# Patient Record
Sex: Female | Born: 1955 | Race: White | Hispanic: No | Marital: Married | Smoking: Never smoker
Health system: Southern US, Community
[De-identification: ages and names within clinical notes are randomized; demographics above are authoritative.]

## PROBLEM LIST (undated history)

## (undated) DIAGNOSIS — H539 Unspecified visual disturbance: Secondary | ICD-10-CM

## (undated) DIAGNOSIS — R001 Bradycardia, unspecified: Secondary | ICD-10-CM

## (undated) DIAGNOSIS — N200 Calculus of kidney: Secondary | ICD-10-CM

## (undated) DIAGNOSIS — C4492 Squamous cell carcinoma of skin, unspecified: Secondary | ICD-10-CM

## (undated) DIAGNOSIS — J45909 Unspecified asthma, uncomplicated: Secondary | ICD-10-CM

## (undated) DIAGNOSIS — R008 Other abnormalities of heart beat: Secondary | ICD-10-CM

## (undated) DIAGNOSIS — T7840XA Allergy, unspecified, initial encounter: Secondary | ICD-10-CM

## (undated) DIAGNOSIS — J42 Unspecified chronic bronchitis: Secondary | ICD-10-CM

## (undated) HISTORY — DX: Unspecified asthma, uncomplicated: J45.909

## (undated) HISTORY — DX: Unspecified visual disturbance: H53.9

## (undated) HISTORY — DX: Unspecified chronic bronchitis: J42

## (undated) HISTORY — DX: Calculus of kidney: N20.0

## (undated) HISTORY — DX: Squamous cell carcinoma of skin, unspecified: C44.92

## (undated) HISTORY — DX: Bradycardia, unspecified: R00.1

## (undated) HISTORY — PX: KIDNEY STONE SURGERY: SHX686

## (undated) HISTORY — PX: WRIST SURGERY: SHX841

## (undated) HISTORY — DX: Other abnormalities of heart beat: R00.8

## (undated) HISTORY — PX: CHOLECYSTECTOMY: SHX55

## (undated) HISTORY — DX: Allergy, unspecified, initial encounter: T78.40XA

---

## 2014-12-13 LAB — HM COLONOSCOPY

## 2018-05-13 ENCOUNTER — Other Ambulatory Visit: Payer: Self-pay

## 2018-05-13 ENCOUNTER — Emergency Department (HOSPITAL_COMMUNITY): Payer: BLUE CROSS/BLUE SHIELD

## 2018-05-13 ENCOUNTER — Encounter (HOSPITAL_COMMUNITY): Payer: Self-pay | Admitting: Emergency Medicine

## 2018-05-13 ENCOUNTER — Emergency Department (HOSPITAL_COMMUNITY)
Admission: EM | Admit: 2018-05-13 | Discharge: 2018-05-13 | Disposition: A | Discharge status: Home / Self Care | Payer: BLUE CROSS/BLUE SHIELD | Attending: Emergency Medicine | Admitting: Emergency Medicine

## 2018-05-13 DIAGNOSIS — M542 Cervicalgia: Secondary | ICD-10-CM | POA: Diagnosis not present

## 2018-05-13 DIAGNOSIS — Y999 Unspecified external cause status: Secondary | ICD-10-CM | POA: Diagnosis not present

## 2018-05-13 DIAGNOSIS — R1084 Generalized abdominal pain: Secondary | ICD-10-CM | POA: Insufficient documentation

## 2018-05-13 DIAGNOSIS — Y939 Activity, unspecified: Secondary | ICD-10-CM | POA: Diagnosis not present

## 2018-05-13 DIAGNOSIS — R410 Disorientation, unspecified: Secondary | ICD-10-CM | POA: Insufficient documentation

## 2018-05-13 DIAGNOSIS — S2241XA Multiple fractures of ribs, right side, initial encounter for closed fracture: Secondary | ICD-10-CM | POA: Diagnosis not present

## 2018-05-13 DIAGNOSIS — Y92410 Unspecified street and highway as the place of occurrence of the external cause: Secondary | ICD-10-CM | POA: Diagnosis not present

## 2018-05-13 DIAGNOSIS — T07XXXA Unspecified multiple injuries, initial encounter: Secondary | ICD-10-CM

## 2018-05-13 DIAGNOSIS — S299XXA Unspecified injury of thorax, initial encounter: Secondary | ICD-10-CM | POA: Diagnosis present

## 2018-05-13 DIAGNOSIS — S2241XD Multiple fractures of ribs, right side, subsequent encounter for fracture with routine healing: Secondary | ICD-10-CM

## 2018-05-13 LAB — COMPREHENSIVE METABOLIC PANEL
ALBUMIN: 4 g/dL (ref 3.5–5.0)
ALT: 50 U/L — AB (ref 0–44)
AST: 46 U/L — AB (ref 15–41)
Alkaline Phosphatase: 87 U/L (ref 38–126)
Anion gap: 10 (ref 5–15)
BUN: 12 mg/dL (ref 8–23)
CO2: 24 mmol/L (ref 22–32)
CREATININE: 0.66 mg/dL (ref 0.44–1.00)
Calcium: 9.1 mg/dL (ref 8.9–10.3)
Chloride: 105 mmol/L (ref 98–111)
GFR calc Af Amer: >60 mL/min (ref >60)
GFR calc non Af Amer: >60 mL/min (ref >60)
Glucose, Bld: 126 mg/dL — ABNORMAL HIGH (ref 70–99)
Potassium: 3.6 mmol/L (ref 3.5–5.1)
Sodium: 139 mmol/L (ref 135–145)
Total Bilirubin: 2.3 mg/dL — ABNORMAL HIGH (ref 0.3–1.2)
Total Protein: 6.6 g/dL (ref 6.5–8.1)

## 2018-05-13 LAB — CBC
HCT: 38.7 % (ref 36.0–46.0)
Hemoglobin: 12.1 g/dL (ref 12.0–15.0)
MCH: 29.9 pg (ref 26.0–34.0)
MCHC: 31.3 g/dL (ref 30.0–36.0)
MCV: 95.6 fL (ref 80.0–100.0)
Platelets: 242 10*3/uL (ref 150–400)
RBC: 4.05 MIL/uL (ref 3.87–5.11)
RDW: 13.2 % (ref 11.5–15.5)
WBC: 8 10*3/uL (ref 4.0–10.5)
nRBC: 0 % (ref 0.0–0.2)

## 2018-05-13 MED ORDER — IOHEXOL 300 MG/ML  SOLN
100.0000 mL | Freq: Once | INTRAMUSCULAR | Status: AC | PRN
  Administered 2018-05-13: 100 mL via INTRAVENOUS

## 2018-05-13 MED ORDER — ONDANSETRON 4 MG PO TBDP
4.0000 mg | ORAL_TABLET | Freq: Once | ORAL | Status: AC | PRN
  Administered 2018-05-13: 4 mg via ORAL
  Filled 2018-05-13: qty 1

## 2018-05-13 MED ORDER — OXYCODONE-ACETAMINOPHEN 5-325 MG PO TABS
1.0000 | ORAL_TABLET | Freq: Once | ORAL | Status: AC
  Administered 2018-05-13: 1 via ORAL
  Filled 2018-05-13: qty 1

## 2018-05-13 MED ORDER — ONDANSETRON 4 MG PO TBDP: 4.0000 mg | ORAL_TABLET | Freq: Three times a day (TID) | ORAL | 0 refills | Status: DC | PRN

## 2018-05-13 MED ORDER — ONDANSETRON 4 MG PO TBDP
4.0000 mg | ORAL_TABLET | Freq: Once | ORAL | Status: AC
  Administered 2018-05-13: 4 mg via ORAL
  Filled 2018-05-13: qty 1
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
## 2018-05-13 NOTE — Discharge Instructions (Addendum)
Take the pain medication that you were previously prescribed.   Take the zofran before you take the pain medication.   Use the incentive spirometer as directed.   Follow with your primary care doctor in a week to repeat chest x-ray to make sure these improving.  Return to emergency department for any persistent fever, difficulty breathing, worsening pain or any other worsening or concerning symptoms.
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
## 2018-05-13 NOTE — ED Notes (Signed)
Patient verbalizes understanding of discharge instructions. Opportunity for questioning and answers were provided. Armband removed by staff, pt discharged from ED via wheelchair to home.  

## 2019-06-30 ENCOUNTER — Other Ambulatory Visit: Payer: Self-pay | Admitting: Family Medicine

## 2019-06-30 DIAGNOSIS — N6489 Other specified disorders of breast: Secondary | ICD-10-CM

## 2019-07-20 ENCOUNTER — Other Ambulatory Visit: Payer: BLUE CROSS/BLUE SHIELD

## 2019-07-27 ENCOUNTER — Encounter: Payer: Self-pay | Admitting: Family Medicine

## 2019-07-27 ENCOUNTER — Other Ambulatory Visit: Payer: Self-pay

## 2019-07-27 ENCOUNTER — Ambulatory Visit (INDEPENDENT_AMBULATORY_CARE_PROVIDER_SITE_OTHER): Payer: 59 | Admitting: Family Medicine

## 2019-07-27 VITALS — BP 112/64 | Temp 97.6°F | Ht 64.0 in | Wt 126.0 lb

## 2019-07-27 DIAGNOSIS — Z0000 Encounter for general adult medical examination without abnormal findings: Secondary | ICD-10-CM

## 2019-07-27 DIAGNOSIS — Z23 Encounter for immunization: Secondary | ICD-10-CM | POA: Diagnosis not present

## 2019-07-27 DIAGNOSIS — Z124 Encounter for screening for malignant neoplasm of cervix: Secondary | ICD-10-CM | POA: Diagnosis not present

## 2019-07-27 DIAGNOSIS — Z1231 Encounter for screening mammogram for malignant neoplasm of breast: Secondary | ICD-10-CM

## 2019-07-27 NOTE — Patient Instructions (Signed)

## 2019-07-28 LAB — COMP. METABOLIC PANEL (12)
AST: 15 IU/L (ref 0–40)
Albumin/Globulin Ratio: 2.1 (ref 1.2–2.2)
Albumin: 4.7 g/dL (ref 3.8–4.8)
Alkaline Phosphatase: 125 IU/L — ABNORMAL HIGH (ref 39–117)
BUN/Creatinine Ratio: 21 (ref 12–28)
BUN: 17 mg/dL (ref 8–27)
Bilirubin Total: 1 mg/dL (ref 0.0–1.2)
Calcium: 9.3 mg/dL (ref 8.7–10.3)
Chloride: 103 mmol/L (ref 96–106)
Creatinine, Ser: 0.82 mg/dL (ref 0.57–1.00)
GFR calc Af Amer: 88 mL/min/{1.73_m2} (ref >59)
GFR calc non Af Amer: 76 mL/min/{1.73_m2} (ref >59)
Globulin, Total: 2.2 g/dL (ref 1.5–4.5)
Glucose: 91 mg/dL (ref 65–99)
Potassium: 4.5 mmol/L (ref 3.5–5.2)
Sodium: 139 mmol/L (ref 134–144)
Total Protein: 6.9 g/dL (ref 6.0–8.5)

## 2019-07-28 LAB — LIPID PANEL
Chol/HDL Ratio: 2.2 ratio (ref 0.0–4.4)
Cholesterol, Total: 184 mg/dL (ref 100–199)
HDL: 85 mg/dL (ref >39)
LDL Chol Calc (NIH): 88 mg/dL (ref 0–99)
Triglycerides: 59 mg/dL (ref 0–149)
VLDL Cholesterol Cal: 11 mg/dL (ref 5–40)

## 2019-07-28 LAB — CBC WITH DIFFERENTIAL/PLATELET
Basophils Absolute: 0 10*3/uL (ref 0.0–0.2)
Basos: 1 %
EOS (ABSOLUTE): 0.1 10*3/uL (ref 0.0–0.4)
Eos: 1 %
Hematocrit: 40.8 % (ref 34.0–46.6)
Hemoglobin: 13.7 g/dL (ref 11.1–15.9)
Immature Grans (Abs): 0 10*3/uL (ref 0.0–0.1)
Immature Granulocytes: 0 %
Lymphocytes Absolute: 1.4 10*3/uL (ref 0.7–3.1)
Lymphs: 29 %
MCH: 31.1 pg (ref 26.6–33.0)
MCHC: 33.6 g/dL (ref 31.5–35.7)
MCV: 93 fL (ref 79–97)
Monocytes Absolute: 0.4 10*3/uL (ref 0.1–0.9)
Monocytes: 8 %
Neutrophils Absolute: 3 10*3/uL (ref 1.4–7.0)
Neutrophils: 61 %
Platelets: 288 10*3/uL (ref 150–450)
RBC: 4.41 x10E6/uL (ref 3.77–5.28)
RDW: 12.3 % (ref 11.7–15.4)
WBC: 4.9 10*3/uL (ref 3.4–10.8)

## 2019-07-28 LAB — CARDIOVASCULAR RISK ASSESSMENT

## 2019-07-28 LAB — TSH: TSH: 1.48 u[IU]/mL (ref 0.450–4.500)

## 2019-07-30 LAB — IGP,CTNG,APTIMAHPV,RFX16/18,45
Chlamydia, Nuc. Acid Amp: NEGATIVE
Gonococcus by Nucleic Acid Amp: NEGATIVE
HPV Aptima: NEGATIVE
PAP Smear Comment: 0

## 2019-08-06 ENCOUNTER — Encounter: Payer: Self-pay | Admitting: Family Medicine

## 2019-08-06 DIAGNOSIS — Z23 Encounter for immunization: Secondary | ICD-10-CM | POA: Insufficient documentation

## 2019-08-06 NOTE — Assessment & Plan Note (Signed)
Healthy female. Continue eating healthy and exercising.  Education given.  Pap taken Mammogram ordered. Pneumovax 23 given (asthma)
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
## 2020-06-10 ENCOUNTER — Encounter: Payer: Self-pay | Admitting: Nurse Practitioner

## 2020-06-10 ENCOUNTER — Telehealth (INDEPENDENT_AMBULATORY_CARE_PROVIDER_SITE_OTHER): Payer: 59 | Admitting: Nurse Practitioner

## 2020-06-10 VITALS — Ht 64.0 in | Wt 121.0 lb

## 2020-06-10 DIAGNOSIS — R059 Cough, unspecified: Secondary | ICD-10-CM | POA: Diagnosis not present

## 2020-06-10 DIAGNOSIS — Z20822 Contact with and (suspected) exposure to covid-19: Secondary | ICD-10-CM

## 2020-06-10 DIAGNOSIS — J029 Acute pharyngitis, unspecified: Secondary | ICD-10-CM | POA: Diagnosis not present

## 2020-06-10 DIAGNOSIS — R0989 Other specified symptoms and signs involving the circulatory and respiratory systems: Secondary | ICD-10-CM | POA: Diagnosis not present

## 2020-06-10 DIAGNOSIS — J4521 Mild intermittent asthma with (acute) exacerbation: Secondary | ICD-10-CM

## 2020-06-10 DIAGNOSIS — J45901 Unspecified asthma with (acute) exacerbation: Secondary | ICD-10-CM

## 2020-06-10 LAB — POC COVID19 BINAXNOW: SARS Coronavirus 2 Ag: NEGATIVE

## 2020-06-10 LAB — POCT RAPID STREP A (OFFICE): Rapid Strep A Screen: NEGATIVE

## 2020-06-10 MED ORDER — AZITHROMYCIN 250 MG PO TABS: ORAL_TABLET | ORAL | 0 refills | Status: DC

## 2020-06-10 MED ORDER — BENZONATATE 100 MG PO CAPS: 200.0000 mg | ORAL_CAPSULE | Freq: Three times a day (TID) | ORAL | 0 refills | Status: DC | PRN

## 2020-06-10 MED ORDER — ALBUTEROL SULFATE HFA 108 (90 BASE) MCG/ACT IN AERS: 2.0000 | INHALATION_SPRAY | Freq: Four times a day (QID) | RESPIRATORY_TRACT | 0 refills | Status: DC | PRN

## 2020-06-10 NOTE — Progress Notes (Signed)
Started Christmas time. Went to get tested but too full and didn't wait. Worried of bronchitis. Was using albuterol inhaler near the beginning but hasn't in the past couple days.   Complains nasal congestion, chest congestion, SOB (hx of asthma), sore throat, cough,  Denies headache, fever.  No one else in house is sick. Got sick when went to visit kids in [place] were sick but didn't know at time. Lasted about 5 days but symptoms are still lingering.   Has had 2 covid vaccines, did have flu shot.
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
## 2020-06-11 ENCOUNTER — Encounter: Payer: Self-pay | Admitting: Nurse Practitioner

## 2020-06-13 LAB — NOVEL CORONAVIRUS, NAA: SARS-CoV-2, NAA: NOT DETECTED

## 2020-07-02 ENCOUNTER — Other Ambulatory Visit: Payer: Self-pay | Admitting: Nurse Practitioner

## 2020-07-02 DIAGNOSIS — J45901 Unspecified asthma with (acute) exacerbation: Secondary | ICD-10-CM

## 2020-07-02 DIAGNOSIS — R059 Cough, unspecified: Secondary | ICD-10-CM

## 2020-08-17 ENCOUNTER — Encounter: Payer: Self-pay | Admitting: Nurse Practitioner

## 2020-08-17 ENCOUNTER — Other Ambulatory Visit: Payer: Self-pay

## 2020-08-17 ENCOUNTER — Telehealth (INDEPENDENT_AMBULATORY_CARE_PROVIDER_SITE_OTHER): Payer: 59 | Admitting: Nurse Practitioner

## 2020-08-17 VITALS — Ht 64.0 in | Wt 123.5 lb

## 2020-08-17 DIAGNOSIS — J3081 Allergic rhinitis due to animal (cat) (dog) hair and dander: Secondary | ICD-10-CM | POA: Diagnosis not present

## 2020-08-17 MED ORDER — NOREL AD 4-10-325 MG PO TABS: 1.0000 | ORAL_TABLET | ORAL | 1 refills | Status: DC | PRN
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
## 2020-11-16 ENCOUNTER — Other Ambulatory Visit: Payer: Self-pay

## 2020-11-16 DIAGNOSIS — Z205 Contact with and (suspected) exposure to viral hepatitis: Secondary | ICD-10-CM

## 2020-11-17 ENCOUNTER — Other Ambulatory Visit: Payer: 59

## 2020-11-17 ENCOUNTER — Other Ambulatory Visit: Payer: Self-pay

## 2020-11-17 DIAGNOSIS — Z205 Contact with and (suspected) exposure to viral hepatitis: Secondary | ICD-10-CM

## 2020-11-18 LAB — ACUTE HEP PANEL AND HEP B SURFACE AB
Hep A IgM: NEGATIVE
Hep B C IgM: NEGATIVE
Hep C Virus Ab: <0.1 s/co ratio (ref 0.0–0.9)
Hepatitis B Surf Ab Quant: 6.6 m[IU]/mL — ABNORMAL LOW (ref >9.9)
Hepatitis B Surface Ag: NEGATIVE

## 2020-11-21 ENCOUNTER — Ambulatory Visit (INDEPENDENT_AMBULATORY_CARE_PROVIDER_SITE_OTHER): Payer: 59 | Admitting: Legal Medicine

## 2020-11-21 ENCOUNTER — Encounter: Payer: Self-pay | Admitting: Legal Medicine

## 2020-11-21 VITALS — BP 90/60 | HR 62 | Temp 97.6°F | Resp 15 | Ht 64.0 in | Wt 118.0 lb

## 2020-11-21 DIAGNOSIS — B181 Chronic viral hepatitis B without delta-agent: Secondary | ICD-10-CM

## 2020-11-21 DIAGNOSIS — Z1231 Encounter for screening mammogram for malignant neoplasm of breast: Secondary | ICD-10-CM | POA: Diagnosis not present
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
## 2020-11-22 LAB — HEPATIC FUNCTION PANEL
ALT: 10 IU/L (ref 0–32)
AST: 13 IU/L (ref 0–40)
Albumin: 4.4 g/dL (ref 3.8–4.8)
Alkaline Phosphatase: 114 IU/L (ref 44–121)
Bilirubin Total: 1.3 mg/dL — ABNORMAL HIGH (ref 0.0–1.2)
Bilirubin, Direct: 0.29 mg/dL (ref 0.00–0.40)
Total Protein: 6.8 g/dL (ref 6.0–8.5)

## 2020-11-22 LAB — HEPATITIS B CORE AB W/REFLEX: Hep B Core Total Ab: NEGATIVE

## 2020-11-22 LAB — HEPATITIS B E ANTIGEN: Hep B E Ag: NEGATIVE

## 2020-11-22 NOTE — Progress Notes (Signed)
Hepatitis B core antibody neg, e antigen negative, bilirubin increased 1.3 , no chronic active hepatitis, ct 2019 does not show any cirrhosis- at this point we will watch. lp

## 2020-11-28 ENCOUNTER — Ambulatory Visit
Admission: RE | Admit: 2020-11-28 | Discharge: 2020-11-28 | Disposition: A | Discharge status: Home / Self Care | Payer: 59 | Source: Ambulatory Visit | Attending: Legal Medicine | Admitting: Legal Medicine

## 2020-11-28 ENCOUNTER — Other Ambulatory Visit: Payer: Self-pay | Admitting: Legal Medicine

## 2020-11-28 ENCOUNTER — Other Ambulatory Visit: Payer: Self-pay

## 2020-11-28 DIAGNOSIS — Z1231 Encounter for screening mammogram for malignant neoplasm of breast: Secondary | ICD-10-CM

## 2020-11-30 NOTE — Progress Notes (Signed)
Mammogram normal lp

## 2021-02-27 ENCOUNTER — Other Ambulatory Visit: Payer: Self-pay

## 2021-02-27 ENCOUNTER — Encounter: Payer: Self-pay | Admitting: Family Medicine

## 2021-02-27 ENCOUNTER — Ambulatory Visit (INDEPENDENT_AMBULATORY_CARE_PROVIDER_SITE_OTHER): Payer: 59 | Admitting: Family Medicine

## 2021-02-27 VITALS — BP 116/68 | HR 72 | Temp 97.0°F | Resp 16 | Ht 60.0 in | Wt 114.2 lb

## 2021-02-27 DIAGNOSIS — Z23 Encounter for immunization: Secondary | ICD-10-CM

## 2021-02-27 DIAGNOSIS — R3589 Other polyuria: Secondary | ICD-10-CM | POA: Diagnosis not present

## 2021-02-27 HISTORY — DX: Other polyuria: R35.89

## 2021-02-27 LAB — POCT URINALYSIS DIPSTICK
Bilirubin, UA: NEGATIVE
Blood, UA: NEGATIVE
Glucose, UA: NEGATIVE
Ketones, UA: NEGATIVE
Leukocytes, UA: NEGATIVE
Nitrite, UA: NEGATIVE
Protein, UA: NEGATIVE
Spec Grav, UA: <=1.005 — AB (ref 1.010–1.025)
Urobilinogen, UA: 0.2 E.U./dL
pH, UA: 6.5 (ref 5.0–8.0)

## 2021-02-27 LAB — GLUCOSE, POCT (MANUAL RESULT ENTRY): POC Glucose: 99 mg/dl (ref 70–99)

## 2021-02-27 NOTE — Assessment & Plan Note (Signed)
Checking labs.  Urinalysis normal.  Consider medicine for urgency.  Likely refer to urology after labs.
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
## 2021-02-27 NOTE — Addendum Note (Signed)
Addended by: Effie Shy on: 02/27/2021 02:26 PM   Modules accepted: Orders

## 2021-02-27 NOTE — Patient Instructions (Signed)
Checking labs.  Urinalysis normal.  Consider medicine for urgency.  Likely refer to urology after labs.

## 2021-02-28 LAB — CBC WITH DIFFERENTIAL/PLATELET
Basophils Absolute: 0 10*3/uL (ref 0.0–0.2)
Basos: 1 %
EOS (ABSOLUTE): 0.1 10*3/uL (ref 0.0–0.4)
Eos: 1 %
Hematocrit: 43.2 % (ref 34.0–46.6)
Hemoglobin: 14.8 g/dL (ref 11.1–15.9)
Immature Grans (Abs): 0 10*3/uL (ref 0.0–0.1)
Immature Granulocytes: 0 %
Lymphocytes Absolute: 1.9 10*3/uL (ref 0.7–3.1)
Lymphs: 33 %
MCH: 31 pg (ref 26.6–33.0)
MCHC: 34.3 g/dL (ref 31.5–35.7)
MCV: 91 fL (ref 79–97)
Monocytes Absolute: 0.4 10*3/uL (ref 0.1–0.9)
Monocytes: 7 %
Neutrophils Absolute: 3.3 10*3/uL (ref 1.4–7.0)
Neutrophils: 58 %
Platelets: 354 10*3/uL (ref 150–450)
RBC: 4.77 x10E6/uL (ref 3.77–5.28)
RDW: 12.6 % (ref 11.7–15.4)
WBC: 5.8 10*3/uL (ref 3.4–10.8)

## 2021-02-28 LAB — COMPREHENSIVE METABOLIC PANEL
ALT: 8 IU/L (ref 0–32)
AST: 14 IU/L (ref 0–40)
Albumin/Globulin Ratio: 1.8 (ref 1.2–2.2)
Albumin: 4.6 g/dL (ref 3.8–4.8)
Alkaline Phosphatase: 118 IU/L (ref 44–121)
BUN/Creatinine Ratio: 17 (ref 12–28)
BUN: 13 mg/dL (ref 8–27)
Bilirubin Total: 1.2 mg/dL (ref 0.0–1.2)
CO2: 24 mmol/L (ref 20–29)
Calcium: 9.8 mg/dL (ref 8.7–10.3)
Chloride: 102 mmol/L (ref 96–106)
Creatinine, Ser: 0.78 mg/dL (ref 0.57–1.00)
Globulin, Total: 2.6 g/dL (ref 1.5–4.5)
Glucose: 91 mg/dL (ref 70–99)
Potassium: 4.9 mmol/L (ref 3.5–5.2)
Sodium: 141 mmol/L (ref 134–144)
Total Protein: 7.2 g/dL (ref 6.0–8.5)
eGFR: 85 mL/min/{1.73_m2} (ref >59)

## 2021-03-01 ENCOUNTER — Other Ambulatory Visit: Payer: Self-pay

## 2021-03-01 DIAGNOSIS — R3589 Other polyuria: Secondary | ICD-10-CM

## 2021-05-01 DIAGNOSIS — R350 Frequency of micturition: Secondary | ICD-10-CM | POA: Diagnosis not present

## 2021-05-01 DIAGNOSIS — R351 Nocturia: Secondary | ICD-10-CM | POA: Diagnosis not present

## 2021-06-08 ENCOUNTER — Encounter: Payer: Self-pay | Admitting: Legal Medicine

## 2021-06-08 ENCOUNTER — Ambulatory Visit (INDEPENDENT_AMBULATORY_CARE_PROVIDER_SITE_OTHER): Payer: Medicare HMO | Admitting: Legal Medicine

## 2021-06-08 VITALS — BP 100/60 | HR 76 | Temp 98.8°F | Resp 14 | Ht 60.0 in | Wt 111.0 lb

## 2021-06-08 DIAGNOSIS — J018 Other acute sinusitis: Secondary | ICD-10-CM

## 2021-06-08 DIAGNOSIS — R058 Other specified cough: Secondary | ICD-10-CM | POA: Diagnosis not present

## 2021-06-08 DIAGNOSIS — J4540 Moderate persistent asthma, uncomplicated: Secondary | ICD-10-CM

## 2021-06-08 DIAGNOSIS — J45909 Unspecified asthma, uncomplicated: Secondary | ICD-10-CM | POA: Insufficient documentation

## 2021-06-08 LAB — POC COVID19 BINAXNOW: SARS Coronavirus 2 Ag: NEGATIVE

## 2021-06-08 LAB — POCT INFLUENZA A/B
Influenza A, POC: NEGATIVE
Influenza B, POC: NEGATIVE

## 2021-06-08 LAB — POCT RAPID STREP A (OFFICE): Rapid Strep A Screen: NEGATIVE

## 2021-06-08 MED ORDER — PREDNISONE 10 MG (21) PO TBPK: ORAL_TABLET | ORAL | 0 refills | Status: DC

## 2021-06-08 MED ORDER — AMOXICILLIN 875 MG PO TABS: 875.0000 mg | ORAL_TABLET | Freq: Two times a day (BID) | ORAL | 0 refills | Status: AC
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
## 2021-06-21 DIAGNOSIS — H53462 Homonymous bilateral field defects, left side: Secondary | ICD-10-CM | POA: Diagnosis not present

## 2021-06-22 ENCOUNTER — Other Ambulatory Visit: Payer: Self-pay

## 2021-06-22 ENCOUNTER — Emergency Department (HOSPITAL_COMMUNITY)
Admission: EM | Admit: 2021-06-22 | Discharge: 2021-06-22 | Disposition: A | Discharge status: Home / Self Care | Payer: Medicare HMO | Attending: Emergency Medicine | Admitting: Emergency Medicine

## 2021-06-22 ENCOUNTER — Emergency Department (HOSPITAL_COMMUNITY): Payer: Medicare HMO

## 2021-06-22 ENCOUNTER — Encounter (HOSPITAL_COMMUNITY): Payer: Self-pay | Admitting: Emergency Medicine

## 2021-06-22 DIAGNOSIS — H53469 Homonymous bilateral field defects, unspecified side: Secondary | ICD-10-CM | POA: Diagnosis not present

## 2021-06-22 DIAGNOSIS — J45909 Unspecified asthma, uncomplicated: Secondary | ICD-10-CM | POA: Insufficient documentation

## 2021-06-22 DIAGNOSIS — R29818 Other symptoms and signs involving the nervous system: Secondary | ICD-10-CM | POA: Diagnosis not present

## 2021-06-22 DIAGNOSIS — H538 Other visual disturbances: Secondary | ICD-10-CM | POA: Diagnosis present

## 2021-06-22 DIAGNOSIS — I639 Cerebral infarction, unspecified: Secondary | ICD-10-CM | POA: Insufficient documentation

## 2021-06-22 DIAGNOSIS — R9431 Abnormal electrocardiogram [ECG] [EKG]: Secondary | ICD-10-CM | POA: Diagnosis not present

## 2021-06-22 DIAGNOSIS — Z85828 Personal history of other malignant neoplasm of skin: Secondary | ICD-10-CM | POA: Diagnosis not present

## 2021-06-22 DIAGNOSIS — Z7982 Long term (current) use of aspirin: Secondary | ICD-10-CM | POA: Insufficient documentation

## 2021-06-22 DIAGNOSIS — H5347 Heteronymous bilateral field defects: Secondary | ICD-10-CM

## 2021-06-22 DIAGNOSIS — I6782 Cerebral ischemia: Secondary | ICD-10-CM | POA: Diagnosis not present

## 2021-06-22 LAB — I-STAT CHEM 8, ED
BUN: 16 mg/dL (ref 8–23)
Calcium, Ion: 1.21 mmol/L (ref 1.15–1.40)
Chloride: 104 mmol/L (ref 98–111)
Creatinine, Ser: 0.6 mg/dL (ref 0.44–1.00)
Glucose, Bld: 95 mg/dL (ref 70–99)
HCT: 40 % (ref 36.0–46.0)
Hemoglobin: 13.6 g/dL (ref 12.0–15.0)
Potassium: 4 mmol/L (ref 3.5–5.1)
Sodium: 139 mmol/L (ref 135–145)
TCO2: 25 mmol/L (ref 22–32)

## 2021-06-22 LAB — CBC
HCT: 40.2 % (ref 36.0–46.0)
Hemoglobin: 13.3 g/dL (ref 12.0–15.0)
MCH: 30.9 pg (ref 26.0–34.0)
MCHC: 33.1 g/dL (ref 30.0–36.0)
MCV: 93.5 fL (ref 80.0–100.0)
Platelets: 307 10*3/uL (ref 150–400)
RBC: 4.3 MIL/uL (ref 3.87–5.11)
RDW: 13.2 % (ref 11.5–15.5)
WBC: 4.3 10*3/uL (ref 4.0–10.5)
nRBC: 0 % (ref 0.0–0.2)

## 2021-06-22 LAB — COMPREHENSIVE METABOLIC PANEL
ALT: 20 U/L (ref 0–44)
AST: 22 U/L (ref 15–41)
Albumin: 4.1 g/dL (ref 3.5–5.0)
Alkaline Phosphatase: 94 U/L (ref 38–126)
Anion gap: 10 (ref 5–15)
BUN: 14 mg/dL (ref 8–23)
CO2: 24 mmol/L (ref 22–32)
Calcium: 9.3 mg/dL (ref 8.9–10.3)
Chloride: 106 mmol/L (ref 98–111)
Creatinine, Ser: 0.71 mg/dL (ref 0.44–1.00)
GFR, Estimated: >60 mL/min (ref >60)
Glucose, Bld: 100 mg/dL — ABNORMAL HIGH (ref 70–99)
Potassium: 4.1 mmol/L (ref 3.5–5.1)
Sodium: 140 mmol/L (ref 135–145)
Total Bilirubin: 1.3 mg/dL — ABNORMAL HIGH (ref 0.3–1.2)
Total Protein: 6.5 g/dL (ref 6.5–8.1)

## 2021-06-22 LAB — DIFFERENTIAL
Abs Immature Granulocytes: 0.01 10*3/uL (ref 0.00–0.07)
Basophils Absolute: 0 10*3/uL (ref 0.0–0.1)
Basophils Relative: 1 %
Eosinophils Absolute: 0.1 10*3/uL (ref 0.0–0.5)
Eosinophils Relative: 1 %
Immature Granulocytes: 0 %
Lymphocytes Relative: 34 %
Lymphs Abs: 1.5 10*3/uL (ref 0.7–4.0)
Monocytes Absolute: 0.3 10*3/uL (ref 0.1–1.0)
Monocytes Relative: 7 %
Neutro Abs: 2.5 10*3/uL (ref 1.7–7.7)
Neutrophils Relative %: 57 %

## 2021-06-22 LAB — CBG MONITORING, ED: Glucose-Capillary: 85 mg/dL (ref 70–99)

## 2021-06-22 LAB — PROTIME-INR
INR: 1.1 (ref 0.8–1.2)
Prothrombin Time: 14.3 seconds (ref 11.4–15.2)

## 2021-06-22 LAB — APTT: aPTT: 36 seconds (ref 24–36)

## 2021-06-22 MED ORDER — SODIUM CHLORIDE 0.9% FLUSH: 3.0000 mL | Freq: Once | INTRAVENOUS | Status: DC

## 2021-06-22 NOTE — ED Notes (Signed)
Pt. Was at [facility] last night when vision started , had a scan and went to eye Dr. As reccommended last night and eye Dr. Durene Cal to ED
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
## 2021-06-22 NOTE — ED Notes (Signed)
Patient transported to MRI 
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
## 2021-06-22 NOTE — Discharge Instructions (Addendum)
It was a pleasure caring for you today in the emergency department.  Please follow-up with neurology and ophthalmology.  Please return to the emergency department for any worsening or worrisome symptoms.

## 2021-07-03 ENCOUNTER — Encounter: Payer: Self-pay | Admitting: *Deleted

## 2021-07-05 ENCOUNTER — Encounter: Payer: Self-pay | Admitting: Diagnostic Neuroimaging

## 2021-07-05 ENCOUNTER — Ambulatory Visit: Payer: Medicare HMO | Admitting: Diagnostic Neuroimaging

## 2021-07-05 VITALS — BP 120/76 | HR 64 | Ht 60.0 in | Wt 114.8 lb

## 2021-07-05 DIAGNOSIS — H539 Unspecified visual disturbance: Secondary | ICD-10-CM

## 2021-07-05 DIAGNOSIS — R69 Illness, unspecified: Secondary | ICD-10-CM | POA: Diagnosis not present

## 2021-07-05 DIAGNOSIS — Z79899 Other long term (current) drug therapy: Secondary | ICD-10-CM | POA: Diagnosis not present

## 2021-07-05 NOTE — Patient Instructions (Addendum)
°  VISUAL / COGNITIVE DISTURBANCE  - unclear etiology; gradual onset and progressive - check lab testing and neuropsychology testing - repeat MRI brain w/wo in 3-6 months - recommend to stop driving for now, at least until symptoms improve
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
## 2021-07-08 LAB — VITAMIN B1: Thiamine: 108.5 nmol/L (ref 66.5–200.0)

## 2021-07-08 LAB — VITAMIN B12: Vitamin B-12: 1166 pg/mL (ref 232–1245)

## 2021-07-08 LAB — TSH: TSH: 2.23 u[IU]/mL (ref 0.450–4.500)

## 2021-07-10 ENCOUNTER — Ambulatory Visit: Payer: Medicare HMO | Admitting: Diagnostic Neuroimaging

## 2021-07-10 DIAGNOSIS — R4182 Altered mental status, unspecified: Secondary | ICD-10-CM

## 2021-07-10 DIAGNOSIS — H539 Unspecified visual disturbance: Secondary | ICD-10-CM

## 2021-07-10 NOTE — Procedures (Signed)
° °  [facility]  EEG (ELECTROENCEPHALOGRAM) REPORT   STUDY DATE: 07/10/21 PATIENT NAME: Ashley Mcbride DOB: [birth year] MRN: [id number]  ORDERING CLINICIAN: Andrey Spearman, MD   TECHNOLOGIST: Myer Peer TECHNIQUE: Electroencephalogram was recorded utilizing standard 10-20 system of lead placement and reformatted into average and bipolar montages.  RECORDING TIME: 23 minutes  ACTIVATION: hyperventilation and photic stimulation  CLINICAL INFORMATION: 66 year old female with abnormal vision spells.  FINDINGS: Posterior dominant background rhythms, which attenuate with eye opening, ranging 8-9 hertz and 10-20 microvolts.  Intermittent left anterior temporal sharp waves with phase reversals mainly at F7 and T3.  No electrographic seizures are seen. Patient recorded in the awake and drowsy state. EKG channel shows regular rhythm of 60-65 beats per minute.   IMPRESSION:   EEG in awake and drowsy states demonstrating: - Intermittent left anterior temporal sharp waves with phase reversals mainly at F7 and T3 which are potentially epileptiform.   INTERPRETING PHYSICIAN:  Penni Bombard, MD Certified in Neurology, Neurophysiology and Neuroimaging  [facility] [address] [place], [place] [address] [phone]

## 2021-07-18 ENCOUNTER — Other Ambulatory Visit: Payer: Self-pay

## 2021-07-18 ENCOUNTER — Ambulatory Visit (INDEPENDENT_AMBULATORY_CARE_PROVIDER_SITE_OTHER): Payer: Medicare HMO | Admitting: Family Medicine

## 2021-07-18 DIAGNOSIS — H53462 Homonymous bilateral field defects, left side: Secondary | ICD-10-CM | POA: Diagnosis not present

## 2021-07-18 DIAGNOSIS — R480 Dyslexia and alexia: Secondary | ICD-10-CM

## 2021-07-18 DIAGNOSIS — H5359 Other color vision deficiencies: Secondary | ICD-10-CM | POA: Diagnosis not present
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
## 2021-07-30 ENCOUNTER — Encounter: Payer: Self-pay | Admitting: Family Medicine

## 2021-07-30 DIAGNOSIS — H5359 Other color vision deficiencies: Secondary | ICD-10-CM

## 2021-07-30 DIAGNOSIS — R480 Dyslexia and alexia: Secondary | ICD-10-CM

## 2021-07-30 DIAGNOSIS — H53462 Homonymous bilateral field defects, left side: Secondary | ICD-10-CM

## 2021-07-30 HISTORY — DX: Dyslexia and alexia: R48.0

## 2021-07-30 HISTORY — DX: Homonymous bilateral field defects, left side: H53.462

## 2021-07-30 HISTORY — DX: Other color vision deficiencies: H53.59

## 2021-07-30 NOTE — Assessment & Plan Note (Addendum)
Management per specialist.  Per Dr. Leta Baptist Repeat mri in 3-6 months per neurology.  If worsening episodes, patient to call neurology and antiseizure medications will be considered.

## 2021-07-30 NOTE — Assessment & Plan Note (Signed)
Management per specialist.  Repeat mri in 3-6 months per neurology.

## 2021-07-30 NOTE — Assessment & Plan Note (Signed)
Etiology unclear.  No driving recommended.

## 2021-08-21 ENCOUNTER — Telehealth: Payer: Self-pay

## 2021-08-21 DIAGNOSIS — R197 Diarrhea, unspecified: Secondary | ICD-10-CM | POA: Diagnosis not present

## 2021-08-21 DIAGNOSIS — E860 Dehydration: Secondary | ICD-10-CM | POA: Diagnosis not present

## 2021-08-21 DIAGNOSIS — R109 Unspecified abdominal pain: Secondary | ICD-10-CM | POA: Diagnosis not present

## 2021-08-21 DIAGNOSIS — K449 Diaphragmatic hernia without obstruction or gangrene: Secondary | ICD-10-CM | POA: Diagnosis not present

## 2021-08-21 DIAGNOSIS — R600 Localized edema: Secondary | ICD-10-CM | POA: Diagnosis not present

## 2021-08-21 DIAGNOSIS — K921 Melena: Secondary | ICD-10-CM | POA: Diagnosis not present

## 2021-08-21 NOTE — Telephone Encounter (Signed)
General/Other - diarrhea ? ?Patient calling as she has been having diarrhea for two weeks. She has had change in color of stool-yesterday. Color is now black. She describes diarrhea as the worse she has ever had. She did take pepto bismol on Saturday- one dose.  ? ?Due to ongoing diarrhea and change in color, patient will be proceeding to hospital for evaluation.  ? ?Ashley Mcbride 08/21/21 8:21 AM ? ? ?

## 2021-08-31 DIAGNOSIS — H53462 Homonymous bilateral field defects, left side: Secondary | ICD-10-CM | POA: Diagnosis not present
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
## 2021-09-04 ENCOUNTER — Encounter: Payer: Self-pay | Admitting: Family Medicine

## 2021-09-04 ENCOUNTER — Ambulatory Visit (INDEPENDENT_AMBULATORY_CARE_PROVIDER_SITE_OTHER): Payer: Medicare HMO | Admitting: Family Medicine

## 2021-09-04 ENCOUNTER — Other Ambulatory Visit: Payer: Self-pay | Admitting: Diagnostic Neuroimaging

## 2021-09-04 VITALS — BP 130/78 | HR 60 | Resp 18 | Ht 64.0 in | Wt 110.4 lb

## 2021-09-04 DIAGNOSIS — Z0000 Encounter for general adult medical examination without abnormal findings: Secondary | ICD-10-CM | POA: Insufficient documentation

## 2021-09-04 DIAGNOSIS — H5359 Other color vision deficiencies: Secondary | ICD-10-CM

## 2021-09-04 DIAGNOSIS — R011 Cardiac murmur, unspecified: Secondary | ICD-10-CM | POA: Diagnosis not present

## 2021-09-04 DIAGNOSIS — R480 Dyslexia and alexia: Secondary | ICD-10-CM

## 2021-09-04 DIAGNOSIS — H53462 Homonymous bilateral field defects, left side: Secondary | ICD-10-CM

## 2021-09-04 HISTORY — DX: Cardiac murmur, unspecified: R01.1

## 2021-09-04 NOTE — Progress Notes (Signed)
Orders Placed This Encounter  ?Procedures  ? MR BRAIN W WO CONTRAST  ? MR ORBITS W WO CONTRAST  ? ? ?Penni Bombard, MD 09/09/2705, 86:75 AM ?Certified in Neurology, Neurophysiology and Neuroimaging ? ?[facility] ?[address], [address] ?[place], [place] [address] ?[phone] ? ?

## 2021-09-04 NOTE — Patient Instructions (Addendum)
Things to do to keep yourself healthy  ?- Exercise at least 30-45 minutes a day, 3-4 days a week.  ?- Eat a low-fat diet with lots of fruits and vegetables, up to 7-9 servings per day.  ?- Seatbelts can save your life. Wear them always.  ?- Smoke detectors on every level of your home, check batteries every year.  ?- Alcohol -  If you drink, do it moderately, less than 2 drinks per day.  ?- Ashley Mcbride. Choose someone to speak for you if you are not able.  ?- Depression is common in our stressful world.If you're feeling down or losing interest in things you normally enjoy, please come in for a visit.  ? ?Ordering echocardiogram. ? ?Get shingrix vaccine and tetanus vaccine at the pharmacy.  ? ? ?

## 2021-09-05 ENCOUNTER — Telehealth: Payer: Self-pay | Admitting: Diagnostic Neuroimaging

## 2021-09-05 NOTE — Telephone Encounter (Signed)
[employer] pending uploaded notes on the portal  

## 2021-09-06 NOTE — Telephone Encounter (Signed)
Ashley Mcbride: [id number] (exp. 09/05/21 to 10/05/21) order sent to GI, they will reach out to the patient to schedule.  ?

## 2021-09-14 ENCOUNTER — Ambulatory Visit (HOSPITAL_BASED_OUTPATIENT_CLINIC_OR_DEPARTMENT_OTHER)
Admission: RE | Admit: 2021-09-14 | Discharge: 2021-09-14 | Disposition: A | Discharge status: Home / Self Care | Payer: Medicare HMO | Source: Ambulatory Visit | Attending: Family Medicine | Admitting: Family Medicine

## 2021-09-14 DIAGNOSIS — R011 Cardiac murmur, unspecified: Secondary | ICD-10-CM | POA: Insufficient documentation

## 2021-09-14 LAB — ECHOCARDIOGRAM COMPLETE
AR max vel: 2.29 cm2
AV Area VTI: 2.29 cm2
AV Area mean vel: 2.06 cm2
AV Mean grad: 3 mmHg
AV Peak grad: 4.8 mmHg
Ao pk vel: 1.1 m/s
Area-P 1/2: 3.48 cm2
S' Lateral: 2.7 cm

## 2021-09-14 NOTE — Progress Notes (Signed)
?  Echocardiogram ?2D Echocardiogram has been performed. ? ?Elmer Ramp ?09/14/2021, 9:28 AM ?

## 2021-09-17 ENCOUNTER — Encounter: Payer: Self-pay | Admitting: Family Medicine

## 2021-09-18 ENCOUNTER — Telehealth: Payer: Self-pay | Admitting: Diagnostic Neuroimaging

## 2021-09-18 ENCOUNTER — Telehealth: Payer: Self-pay

## 2021-09-18 ENCOUNTER — Other Ambulatory Visit: Payer: Self-pay

## 2021-09-18 DIAGNOSIS — R480 Dyslexia and alexia: Secondary | ICD-10-CM

## 2021-09-18 DIAGNOSIS — H53462 Homonymous bilateral field defects, left side: Secondary | ICD-10-CM

## 2021-09-18 NOTE — Telephone Encounter (Signed)
Patient is scheduled at GI for 09/26/21.  ?

## 2021-09-18 NOTE — Telephone Encounter (Signed)
Patient notified  She is scheduled for an MRI next week but she wants to proceed with the different neurologist.   ?

## 2021-09-18 NOTE — Telephone Encounter (Signed)
General/Other - neurology referral  ? ? ?She is requesting another neurology referral. She is currently seeing Dr Leta Baptist, she feels he is absent and she would like answers. She has seen this provider once over the last 7 months. She is unsure of another provider to see.  ? ?Ashley Mcbride 09/18/21 9:19 AM ? ? ?

## 2021-09-26 ENCOUNTER — Ambulatory Visit
Admission: RE | Admit: 2021-09-26 | Discharge: 2021-09-26 | Disposition: A | Discharge status: Home / Self Care | Payer: Medicare HMO | Source: Ambulatory Visit | Attending: Diagnostic Neuroimaging | Admitting: Diagnostic Neuroimaging

## 2021-09-26 DIAGNOSIS — H5359 Other color vision deficiencies: Secondary | ICD-10-CM

## 2021-09-26 DIAGNOSIS — H53462 Homonymous bilateral field defects, left side: Secondary | ICD-10-CM | POA: Diagnosis not present

## 2021-09-26 DIAGNOSIS — R480 Dyslexia and alexia: Secondary | ICD-10-CM

## 2021-09-26 MED ORDER — GADOBENATE DIMEGLUMINE 529 MG/ML IV SOLN
9.0000 mL | Freq: Once | INTRAVENOUS | Status: AC | PRN
  Administered 2021-09-26: 9 mL via INTRAVENOUS

## 2021-10-02 ENCOUNTER — Encounter: Payer: Self-pay | Admitting: Neurology
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
## 2021-10-13 ENCOUNTER — Ambulatory Visit: Payer: Medicare HMO | Admitting: Neurology

## 2021-10-13 ENCOUNTER — Encounter: Payer: Self-pay | Admitting: Neurology

## 2021-10-13 VITALS — BP 113/72 | HR 73 | Ht 64.0 in | Wt 104.2 lb

## 2021-10-13 DIAGNOSIS — H53462 Homonymous bilateral field defects, left side: Secondary | ICD-10-CM

## 2021-10-13 DIAGNOSIS — R419 Unspecified symptoms and signs involving cognitive functions and awareness: Secondary | ICD-10-CM | POA: Diagnosis not present

## 2021-10-13 DIAGNOSIS — H539 Unspecified visual disturbance: Secondary | ICD-10-CM | POA: Diagnosis not present

## 2021-10-13 NOTE — Patient Instructions (Signed)
Will order neuropsychological evaluation with Dr. Melvyn Novas for May 26 ?24 ambulatory EEG ?Refer to neuro-ophthalmology at [facility] ?Further recommendations pending results. ?

## 2021-10-27 ENCOUNTER — Encounter: Payer: Medicare HMO | Admitting: Psychology

## 2021-11-03 ENCOUNTER — Encounter: Payer: Medicare HMO | Admitting: Psychology

## 2021-11-17 ENCOUNTER — Ambulatory Visit: Payer: Medicare HMO | Admitting: Psychology

## 2021-11-17 ENCOUNTER — Encounter: Payer: Self-pay | Admitting: Psychology

## 2021-11-17 DIAGNOSIS — G3184 Mild cognitive impairment, so stated: Secondary | ICD-10-CM

## 2021-11-17 DIAGNOSIS — R4189 Other symptoms and signs involving cognitive functions and awareness: Secondary | ICD-10-CM

## 2021-11-17 DIAGNOSIS — H539 Unspecified visual disturbance: Secondary | ICD-10-CM | POA: Diagnosis not present

## 2021-11-17 HISTORY — DX: Mild cognitive impairment of uncertain or unknown etiology: G31.84

## 2021-11-17 NOTE — Progress Notes (Signed)
   Psychometrician Note   Cognitive testing was administered to Ashley Mcbride by Ashley Mcbride, B.S. (psychometrist) under the supervision of Dr. Christia Reading, Ph.D., licensed psychologist on 11/17/2021. Ashley Mcbride did not appear overtly distressed by the testing session per behavioral observation or responses across self-report questionnaires. Rest breaks were offered.    The battery of tests administered was selected by Dr. Christia Reading, Ph.D. with consideration to Ashley Mcbride's current level of functioning, the nature of her symptoms, emotional and behavioral responses during interview, level of literacy, observed level of motivation/effort, and the nature of the referral question. This battery was communicated to the psychometrist. Communication between Dr. Christia Reading, Ph.D. and the psychometrist was ongoing throughout the evaluation and Dr. Christia Reading, Ph.D. was immediately accessible at all times. Dr. Christia Reading, Ph.D. provided supervision to the psychometrist on the date of this service to the extent necessary to assure the quality of all services provided.    Ashley Mcbride will return within approximately 1-2 weeks for an interactive feedback session with Ashley Mcbride at which time her test performances, clinical impressions, and treatment recommendations will be reviewed in detail. Ashley Mcbride understands she can contact our office should she require our assistance before this time.  A total of 160 minutes of billable time were spent face-to-face with Ashley Mcbride by the psychometrist. This includes both test administration and scoring time. Billing for these services is reflected in the clinical report generated by Dr. Christia Reading, Ph.D.  This note reflects time spent with the psychometrician and does not include test scores or any clinical interpretations made by Ashley Mcbride. The full report will follow in a separate note.

## 2021-11-17 NOTE — Progress Notes (Unsigned)
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 Recall 4/17 (19) <1 Exceptionally Low    Retention Percentage 29 (10) <1 Exceptionally Low    Recognition Hits 6/10 (28) 2 Well Below Average       Attention/Executive Function:     Trail Making Test (TMT): Raw Score (T Score) Percentile     Part A 154 secs.,  2 errors (17) <1 Exceptionally Low    Part B Discontinued --- Impaired         Scaled Score Percentile   WAIS-IV Coding: Discontinued (vision) --- ---       NAB Attention Module, Form 1: T Score Percentile     Digits Forward 63 91 Well Above Average    Digits Backwards 46 34 Average        Scaled Score Percentile   WAIS-IV Similarities: 6 9 Below Average       D-KEFS Color-Word Interference Test: Raw Score (Scaled Score) Percentile     Color Naming 54 secs. (1) <1 Exceptionally Low    Word Reading 36 secs. (4) 2 Well Below Average    Inhibition Discontinued --- Impaired    Inhibition/Switching Discontinued --- Impaired       D-KEFS Verbal Fluency Test: Raw Score (Scaled Score) Percentile     Letter Total Correct 25 (7) 16 Below Average    Category Total Correct 28 (7) 16 Below Average    Category Switching Total  Correct 4 (1) <1 Exceptionally Low    Category Switching Accuracy 3 (2) <1 Exceptionally Low      Total Set Loss Errors 3 (9) 37 Average      Total Repetition Errors 6 (7) 16 Below Average       Language:     Verbal Fluency Test: Raw Score (T Score) Percentile     Phonemic Fluency (FAS) 25 (35) 7 Well Below Average    Animal Fluency 14 (39) 14 Below Average        NAB Language Module, Form 1: T Score Percentile     Naming 29/31 (46) 34 Average       Visuospatial/Visuoconstruction:      Raw Score Percentile   Line Bisection: --- --- Abnormal  Clock Drawing: 5/10 --- Impaired  NAB Spatial Module, Form 1: T Score Percentile     Visual Discrimination 22 <1 Exceptionally Low    Figure Drawing Copy 20 <1 Exceptionally Low        Scaled Score Percentile   WAIS-IV Block Design: Discontinued --- Impaired  WAIS-IV Visual Puzzles: 6 9 Below Average       Mood and Personality:      Raw Score Percentile   PROMIS Depression Questionnaire: 8 --- None to Slight  PROMIS Anxiety Questionnaire: 11 --- None to Slight       Additional Questionnaires:      Raw Score Percentile   PROMIS Sleep Disturbance Questionnaire: 12 --- None to Slight   Informed Consent and Coding/Compliance:   The current evaluation represents a clinical evaluation for the purposes previously outlined by the referral source and is in no way reflective of a forensic evaluation.   Ms. Sallie was provided with a verbal description of the nature and purpose of the present neuropsychological evaluation. Also reviewed were the foreseeable risks and/or discomforts and benefits of the procedure, limits of confidentiality, and mandatory reporting requirements of this provider. The patient was given the opportunity to ask questions and receive answers about the evaluation. Oral consent to participate was provided by the patient.   This evaluation was conducted by Christia Reading, Ph.D., ABPP-CN, board certified clinical  neuropsychologist. Ms. Lawal completed a clinical interview with Dr. Melvyn Novas, billed as one unit [phone], and 160 minutes of cognitive testing and scoring, billed as one unit [phone] and four additional units [id number]. Psychometrist Milana Kidney, B.S., assisted Dr. Melvyn Novas with test administration and scoring procedures. As a separate and discrete service, Dr. Melvyn Novas spent a total of 160 minutes in interpretation and report writing billed as one unit [phone] and two units 96133.

## 2021-11-20 ENCOUNTER — Encounter: Payer: Self-pay | Admitting: Psychology

## 2021-11-20 DIAGNOSIS — H539 Unspecified visual disturbance: Secondary | ICD-10-CM | POA: Insufficient documentation

## 2021-11-23 ENCOUNTER — Telehealth: Payer: Self-pay | Admitting: Neurology

## 2021-11-23 NOTE — Telephone Encounter (Signed)
Patient called and stated she is scheduled to have her EEG on 6/27, she then noticed her appt was scheduled on that same day.  She was just a little confused as to if she needs to come in that day?

## 2021-11-24 ENCOUNTER — Ambulatory Visit: Payer: Medicare HMO | Admitting: Psychology

## 2021-11-24 DIAGNOSIS — H53462 Homonymous bilateral field defects, left side: Secondary | ICD-10-CM | POA: Diagnosis not present

## 2021-11-24 DIAGNOSIS — G3184 Mild cognitive impairment, so stated: Secondary | ICD-10-CM | POA: Diagnosis not present

## 2021-11-28 ENCOUNTER — Ambulatory Visit (INDEPENDENT_AMBULATORY_CARE_PROVIDER_SITE_OTHER): Payer: Medicare HMO | Admitting: Neurology

## 2021-11-28 DIAGNOSIS — H539 Unspecified visual disturbance: Secondary | ICD-10-CM | POA: Diagnosis not present

## 2021-11-28 DIAGNOSIS — R569 Unspecified convulsions: Secondary | ICD-10-CM | POA: Diagnosis not present

## 2021-11-28 DIAGNOSIS — R419 Unspecified symptoms and signs involving cognitive functions and awareness: Secondary | ICD-10-CM | POA: Diagnosis not present

## 2021-11-28 DIAGNOSIS — H53462 Homonymous bilateral field defects, left side: Secondary | ICD-10-CM
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
## 2021-12-06 ENCOUNTER — Ambulatory Visit: Payer: Medicare HMO | Admitting: Neurology

## 2021-12-06 ENCOUNTER — Encounter: Payer: Self-pay | Admitting: Neurology

## 2021-12-06 VITALS — BP 121/85 | HR 61 | Ht 64.0 in | Wt 106.0 lb

## 2021-12-06 DIAGNOSIS — G3184 Mild cognitive impairment, so stated: Secondary | ICD-10-CM | POA: Diagnosis not present

## 2021-12-06 MED ORDER — DONEPEZIL HCL 5 MG PO TABS: 5.0000 mg | ORAL_TABLET | Freq: Every day | ORAL | 0 refills | Status: DC

## 2021-12-06 NOTE — Progress Notes (Signed)
[id number] Approved 11/06/21-01/05/22. Rep Jhorlene Q.    Appt 12/15/21 at 10:30 am Arrive 10am NPO 6 hours before appt. At [facility] long

## 2021-12-06 NOTE — Patient Instructions (Addendum)
We will start donepezil (Aricept) '5mg'$  daily for four weeks.  If you are tolerating the medication, then after four weeks, we will increase the dose to '10mg'$  daily.  Side effects include nausea, vomiting, diarrhea, vivid dreams, and muscle cramps.  Please call the clinic if you experience any of these symptoms. Will get  PET scan of the brain to help support a diagnosis.  Further recommendations pending results. Follow up after testing.

## 2021-12-07 ENCOUNTER — Telehealth: Payer: Self-pay | Admitting: Neurology

## 2021-12-07 MED ORDER — DONEPEZIL HCL 5 MG PO TABS: 5.0000 mg | ORAL_TABLET | Freq: Every day | ORAL | 0 refills | Status: DC

## 2021-12-07 NOTE — Telephone Encounter (Signed)
Pt called in stating a prescription was sent to [facility] yesterday for her Aricept. She would like to see if it could be sent to [facility] in [place]?

## 2021-12-08 NOTE — Procedures (Signed)
Patient's Name: Ashley Mcbride MRN: [id number] Date of Birth: 02/28/1956   Ordering Provider: Metta Clines, MD DX CODE(s): R41.9, R56.9, H53.9 EXAM DURATION: 22 hours   CLINICAL HISTORY: This is a 66 year old woman with visual changes and memory loss with episodes lasting 1-2 minutes. EEG for classification.    MEDICATION(s): melatonin, Albuterol, meclizine   TECHNICAL DESCRIPTION: Long-Term EEG with Video was monitored intermittently by a qualified EEG technologist for the entirety of the recording; quality check-ins were performed at a minimum of every two hours, checking, and documenting real-time data and video to assure the integrity and quality of the recording (e.g., camera position, electrode integrity and impedance), and identify the need for maintenance. For intermittent monitoring, an EEG Technologist monitored no more than 12 patients concurrently. Video was being recorded at least 80% of the time during the study duration, unless otherwise noted in an Exception Statement.   At the end of the recording, the EEG Technologist generates a technical description, which is the EEG Technologist's written documentation of the reviewed video-EEG data, including technical interventions and these elements: reviewing raw EEG/VEEG data and events and automated detection as well as patient pushbutton event activations; and annotating, editing, and archiving EEG/VEEG data for review by the physician or other qualified healthcare professional. For review, the Video EEG recording can be visualized in all standard types of montages, 16 channels and greater, and playbacks include digital high frequency filters previously noted. The Video EEG has been notated with patient typical symptom events at the direction of the patient by depressing a push button mounted on a waist worn Lifelines EEG recording device. Digital spike and seizure detection software was used to identify potential abnormalities in the EEG, and  alerts were reviewed and annotated by the technologist in the [employer] EEG Review software. Video EEG and report are notated with events that were determined to be of significance by the digital analysis software showing spike and seizure detections.   SET-UP TECH: [place] Neighbor   RECORDING SET-UP DATE: 11/28/2021 12:01PM  RECORDING TAKE-DOWN DATE: 11/29/2021 10:20AM     SPIKE AND SEIZURE ANALYSIS AND REVIEW: Spike and seizure detection software alerts have been reviewed by a Production designer, theatre/television/film. None of these alerts appear to have clinical significance.  PUSH BUTTON EVENTS: A patient diary was maintained. The patient did not press the button nor did they describe any typical symptoms. 31 button presses were accidental, test, or monitoring tech driven resets.    DESCRIPTION OF RECORDING: During maximal wakefulness, the background activity consisted of a symmetric 8 Hz posterior dominant rhythm that was reactive to eye opening and eye closure. The background is symmetric. There were no epileptiform discharges or electrographic seizures seen in wakefulness.   During the recording, the patient progresses through wakefulness, drowsiness, and sleep. During drowsiness and sleep, there is an increase in theta slowing of the background, at times sharply contoured over the left temporal region, consistent with wicket spikes, a normal variant. Vertex waves and sleep spindles were seen. Again there were no epileptiform discharges seen.   EKG lead was unremarkable.   PUSH BUTTON EVENTS: There were no push button events.    IMPRESSION: This 22-hour ambulatory video EEG study is normal.    CLINICAL CORRELATION: A normal EEG does not rule out a clinical diagnosis of epilepsy. Typical events were not captured. If further clinical questions remain, inpatient video EEG monitoring may be helpful.

## 2021-12-12 ENCOUNTER — Ambulatory Visit (INDEPENDENT_AMBULATORY_CARE_PROVIDER_SITE_OTHER): Payer: Medicare HMO | Admitting: Family Medicine

## 2021-12-12 VITALS — BP 110/70 | HR 68 | Temp 96.1°F | Resp 14 | Ht 64.0 in | Wt 105.2 lb

## 2021-12-12 DIAGNOSIS — G319 Degenerative disease of nervous system, unspecified: Secondary | ICD-10-CM | POA: Diagnosis not present

## 2021-12-12 DIAGNOSIS — Z23 Encounter for immunization: Secondary | ICD-10-CM
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
## 2021-12-15 ENCOUNTER — Encounter (HOSPITAL_COMMUNITY)
Admission: RE | Admit: 2021-12-15 | Discharge: 2021-12-15 | Disposition: A | Discharge status: Home / Self Care | Payer: Medicare HMO | Source: Ambulatory Visit | Attending: Neurology | Admitting: Neurology

## 2021-12-15 DIAGNOSIS — G3183 Dementia with Lewy bodies: Secondary | ICD-10-CM | POA: Diagnosis not present

## 2021-12-15 DIAGNOSIS — G3184 Mild cognitive impairment, so stated: Secondary | ICD-10-CM | POA: Diagnosis not present

## 2021-12-15 DIAGNOSIS — F0390 Unspecified dementia without behavioral disturbance: Secondary | ICD-10-CM | POA: Diagnosis not present

## 2021-12-15 LAB — GLUCOSE, CAPILLARY: Glucose-Capillary: 88 mg/dL (ref 70–99)

## 2021-12-15 MED ORDER — FLUDEOXYGLUCOSE F - 18 (FDG) INJECTION
10.0000 | Freq: Once | INTRAVENOUS | Status: AC
  Administered 2021-12-15: 9.96 via INTRAVENOUS

## 2021-12-19 ENCOUNTER — Encounter: Payer: Self-pay | Admitting: Neurology

## 2021-12-19 ENCOUNTER — Ambulatory Visit: Payer: Medicare HMO | Admitting: Neurology

## 2021-12-19 VITALS — BP 115/74 | HR 56 | Ht 64.0 in | Wt 106.2 lb

## 2021-12-19 DIAGNOSIS — G319 Degenerative disease of nervous system, unspecified: Secondary | ICD-10-CM | POA: Diagnosis not present

## 2021-12-19 NOTE — Patient Instructions (Addendum)
Contact me when you need a refill of donepezil and I will increase dose to '10mg'$  at bedtime Follow up in 6 months.
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
## 2021-12-29 ENCOUNTER — Other Ambulatory Visit: Payer: Self-pay | Admitting: Neurology

## 2022-01-03 DIAGNOSIS — H53462 Homonymous bilateral field defects, left side: Secondary | ICD-10-CM | POA: Diagnosis not present

## 2022-01-05 ENCOUNTER — Telehealth: Payer: Self-pay | Admitting: Neurology

## 2022-01-05 MED ORDER — DONEPEZIL HCL 10 MG PO TABS: 10.0000 mg | ORAL_TABLET | Freq: Every day | ORAL | 5 refills | Status: DC

## 2022-01-05 NOTE — Telephone Encounter (Signed)
Advised patient script sent to the wrong pharmacy.  Will send to correct pharmacy. Cancelled at [facility].

## 2022-01-05 NOTE — Telephone Encounter (Signed)
1. Which medications need refilled? (List name and dosage, if known) Aricept   2. Which pharmacy/location is medication to be sent to? (include street and city if Management consultant) [facility] in [place]

## 2022-01-08 ENCOUNTER — Telehealth: Payer: Self-pay | Admitting: Neurology

## 2022-01-08 NOTE — Telephone Encounter (Signed)
Would stay on '5mg'$  for now until further instructions when Dr. Tomi Likens returns, thanks

## 2022-01-08 NOTE — Telephone Encounter (Signed)
Spoke to the patient husband, Patient had a whole body shaking, weakness in her lower limbs unable to walk. Patient was really scared.   Patient doing okay right now, But they wanted  know if she should just continue on the 5 mg of Donepezil.

## 2022-01-08 NOTE — Telephone Encounter (Signed)
Patients husband called and stated Ashley Mcbride had a severe reaction to the donepezil increase.

## 2022-01-08 NOTE — Telephone Encounter (Signed)
Go back on Donepezil '5mg'$ , thanks

## 2022-01-08 NOTE — Telephone Encounter (Signed)
Spoke to the patient husband to advised to decrease back to the 5 mg.  Husband wanted to know if eventually gradually increase to the 10 mg by using a pill cutter to cut the 10 mg into fourths and given the fourth with the 5 mg until eventually getting to 10 mg.   Advised husband I will send this back and see what Dr.Aqunio says.

## 2022-01-09 MED ORDER — DONEPEZIL HCL 5 MG PO TABS: 5.0000 mg | ORAL_TABLET | Freq: Every day | ORAL | 0 refills | Status: DC

## 2022-01-12 ENCOUNTER — Other Ambulatory Visit: Payer: Self-pay

## 2022-01-15 ENCOUNTER — Other Ambulatory Visit: Payer: Self-pay

## 2022-01-15 MED ORDER — DICYCLOMINE HCL 20 MG PO TABS: 20.0000 mg | ORAL_TABLET | Freq: Two times a day (BID) | ORAL | 0 refills | Status: DC | PRN

## 2022-01-15 NOTE — Telephone Encounter (Signed)
Bentyl sent. Dr. Tobie Poet

## 2022-01-16 ENCOUNTER — Other Ambulatory Visit: Payer: Self-pay

## 2022-01-16 ENCOUNTER — Telehealth: Payer: Self-pay

## 2022-01-16 NOTE — Telephone Encounter (Signed)
Ashley Mcbride called about refilling her bentyl.  She and her husband are on the way to [facility] and she will need the RX sent to a pharmacy there.  She will call us back when she arrives and let us know where to send the RX.

## 2022-01-21 ENCOUNTER — Encounter: Payer: Self-pay | Admitting: Family Medicine

## 2022-01-21 DIAGNOSIS — G319 Degenerative disease of nervous system, unspecified: Secondary | ICD-10-CM | POA: Insufficient documentation

## 2022-01-21 HISTORY — DX: Degenerative disease of nervous system, unspecified: G31.9

## 2022-01-21 NOTE — Assessment & Plan Note (Signed)
Defer management to Dr. Tomi Likens.  Continue aricept.  Keep appt for PET Scan and follow up with Dr. Traci Sermon.

## 2022-02-08 DIAGNOSIS — H5213 Myopia, bilateral: Secondary | ICD-10-CM | POA: Diagnosis not present

## 2022-02-08 DIAGNOSIS — H524 Presbyopia: Secondary | ICD-10-CM | POA: Diagnosis not present

## 2022-02-08 DIAGNOSIS — H52209 Unspecified astigmatism, unspecified eye: Secondary | ICD-10-CM | POA: Diagnosis not present

## 2022-02-14 ENCOUNTER — Ambulatory Visit: Payer: Medicare HMO | Admitting: Neurology

## 2022-04-23 ENCOUNTER — Telehealth: Payer: Self-pay

## 2022-04-23 NOTE — Telephone Encounter (Signed)
Mrs. Ashley Mcbride called with concerns that she and Mr. Ashley Mcbride have been coughing for about 1 week.  They have been taking mucinex with no relief.  She has out of town guest coming later in the week.  She reports no fever but is requesting an antibiotic.  She was instructed to log in to my chart and schedule a video visit since we do not have any appointments today.

## 2022-04-25 ENCOUNTER — Ambulatory Visit (INDEPENDENT_AMBULATORY_CARE_PROVIDER_SITE_OTHER): Payer: Medicare HMO | Admitting: Physician Assistant

## 2022-04-25 ENCOUNTER — Encounter: Payer: Self-pay | Admitting: Physician Assistant

## 2022-04-25 VITALS — BP 108/70 | HR 77 | Temp 96.5°F | Wt 105.0 lb

## 2022-04-25 DIAGNOSIS — J069 Acute upper respiratory infection, unspecified: Secondary | ICD-10-CM

## 2022-04-25 LAB — POC COVID19 BINAXNOW: SARS Coronavirus 2 Ag: NEGATIVE

## 2022-04-25 MED ORDER — AZITHROMYCIN 250 MG PO TABS: ORAL_TABLET | ORAL | 0 refills | Status: AC
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
## 2022-06-01 ENCOUNTER — Ambulatory Visit (INDEPENDENT_AMBULATORY_CARE_PROVIDER_SITE_OTHER): Payer: Medicare HMO | Admitting: Family Medicine

## 2022-06-01 VITALS — BP 138/82 | HR 52 | Temp 97.5°F | Ht 64.0 in | Wt 103.0 lb

## 2022-06-01 DIAGNOSIS — G319 Degenerative disease of nervous system, unspecified: Secondary | ICD-10-CM

## 2022-06-01 MED ORDER — MEMANTINE HCL 10 MG PO TABS: 10.0000 mg | ORAL_TABLET | Freq: Two times a day (BID) | ORAL | 3 refills | Status: DC
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
## 2022-06-05 ENCOUNTER — Encounter: Payer: Self-pay | Admitting: Family Medicine

## 2022-06-05 NOTE — Assessment & Plan Note (Addendum)
Discontinued Aricept. Start Namenda 10 mg twice daily  Follow up with neurologist. Patient to let me know if needs medicine for depression.
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
## 2022-06-21 ENCOUNTER — Encounter: Payer: Self-pay | Admitting: Neurology

## 2022-06-21 ENCOUNTER — Ambulatory Visit: Payer: Medicare HMO | Admitting: Neurology

## 2022-06-21 VITALS — BP 126/57 | HR 53 | Ht 63.0 in | Wt 105.4 lb

## 2022-06-21 DIAGNOSIS — G319 Degenerative disease of nervous system, unspecified: Secondary | ICD-10-CM

## 2022-07-09 DIAGNOSIS — H53462 Homonymous bilateral field defects, left side: Secondary | ICD-10-CM | POA: Diagnosis not present

## 2022-09-06 ENCOUNTER — Ambulatory Visit (INDEPENDENT_AMBULATORY_CARE_PROVIDER_SITE_OTHER): Payer: Medicare HMO | Admitting: Family Medicine

## 2022-09-06 ENCOUNTER — Encounter: Payer: Self-pay | Admitting: Family Medicine

## 2022-09-06 VITALS — BP 122/82 | HR 59 | Temp 97.0°F | Ht 63.0 in | Wt 103.0 lb

## 2022-09-06 DIAGNOSIS — Z13820 Encounter for screening for osteoporosis: Secondary | ICD-10-CM

## 2022-09-06 DIAGNOSIS — Z780 Asymptomatic menopausal state: Secondary | ICD-10-CM

## 2022-09-06 DIAGNOSIS — R197 Diarrhea, unspecified: Secondary | ICD-10-CM

## 2022-09-06 DIAGNOSIS — Z1231 Encounter for screening mammogram for malignant neoplasm of breast: Secondary | ICD-10-CM

## 2022-09-06 DIAGNOSIS — Z7189 Other specified counseling: Secondary | ICD-10-CM | POA: Diagnosis not present

## 2022-09-06 DIAGNOSIS — Z0000 Encounter for general adult medical examination without abnormal findings: Secondary | ICD-10-CM
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
## 2022-09-06 NOTE — Patient Instructions (Addendum)
Recommend tetanus (TDAP), Shingles vaccine series, and RSV vaccine.  Things to do to keep yourself healthy  - Exercise at least 30-45 minutes a day, 3-4 days a week.  - Eat a low-fat diet with lots of fruits and vegetables, up to 7-9 servings per day.  - Seatbelts can save your life. Wear them always.  - Smoke detectors on every level of your home, check batteries every year.  - Eye Doctor - have an eye exam every 1-2 years  - Alcohol -  If you drink, do it moderately, less than 2 drinks per day.  - [place]. Choose someone to speak for you if you are not able.  - Depression is common in our stressful world.If you're feeling down or losing interest in things you normally enjoy, please come in for a visit.  - Violence - If anyone is threatening or hurting you, please call immediately.

## 2022-09-07 ENCOUNTER — Other Ambulatory Visit: Payer: Medicare HMO

## 2022-09-07 LAB — CBC WITH DIFFERENTIAL/PLATELET
Basophils Absolute: 0 10*3/uL (ref 0.0–0.2)
Basos: 1 %
EOS (ABSOLUTE): 0.1 10*3/uL (ref 0.0–0.4)
Eos: 3 %
Hematocrit: 42.3 % (ref 34.0–46.6)
Hemoglobin: 13.9 g/dL (ref 11.1–15.9)
Immature Grans (Abs): 0 10*3/uL (ref 0.0–0.1)
Immature Granulocytes: 0 %
Lymphocytes Absolute: 1.3 10*3/uL (ref 0.7–3.1)
Lymphs: 26 %
MCH: 30.4 pg (ref 26.6–33.0)
MCHC: 32.9 g/dL (ref 31.5–35.7)
MCV: 93 fL (ref 79–97)
Monocytes Absolute: 0.4 10*3/uL (ref 0.1–0.9)
Monocytes: 8 %
Neutrophils Absolute: 3.2 10*3/uL (ref 1.4–7.0)
Neutrophils: 62 %
Platelets: 301 10*3/uL (ref 150–450)
RBC: 4.57 x10E6/uL (ref 3.77–5.28)
RDW: 12.5 % (ref 11.7–15.4)
WBC: 5.1 10*3/uL (ref 3.4–10.8)

## 2022-09-07 LAB — COMPREHENSIVE METABOLIC PANEL
ALT: 25 IU/L (ref 0–32)
AST: 21 IU/L (ref 0–40)
Albumin/Globulin Ratio: 2.2 (ref 1.2–2.2)
Albumin: 4.8 g/dL (ref 3.9–4.9)
Alkaline Phosphatase: 108 IU/L (ref 44–121)
BUN/Creatinine Ratio: 20 (ref 12–28)
BUN: 16 mg/dL (ref 8–27)
Bilirubin Total: 1.5 mg/dL — ABNORMAL HIGH (ref 0.0–1.2)
CO2: 24 mmol/L (ref 20–29)
Calcium: 9.9 mg/dL (ref 8.7–10.3)
Chloride: 101 mmol/L (ref 96–106)
Creatinine, Ser: 0.8 mg/dL (ref 0.57–1.00)
Globulin, Total: 2.2 g/dL (ref 1.5–4.5)
Glucose: 90 mg/dL (ref 70–99)
Potassium: 5.2 mmol/L (ref 3.5–5.2)
Sodium: 139 mmol/L (ref 134–144)
Total Protein: 7 g/dL (ref 6.0–8.5)
eGFR: 81 mL/min/{1.73_m2} (ref >59)

## 2022-09-07 LAB — TSH: TSH: 2.18 u[IU]/mL (ref 0.450–4.500)

## 2022-09-08 DIAGNOSIS — Z1231 Encounter for screening mammogram for malignant neoplasm of breast: Secondary | ICD-10-CM | POA: Insufficient documentation

## 2022-09-08 DIAGNOSIS — R197 Diarrhea, unspecified: Secondary | ICD-10-CM | POA: Insufficient documentation

## 2022-09-08 DIAGNOSIS — Z780 Asymptomatic menopausal state: Secondary | ICD-10-CM | POA: Insufficient documentation

## 2022-09-08 DIAGNOSIS — Z7189 Other specified counseling: Secondary | ICD-10-CM | POA: Insufficient documentation

## 2022-09-08 NOTE — Assessment & Plan Note (Signed)
Order DEXA 

## 2022-09-08 NOTE — Assessment & Plan Note (Signed)
Recommend tetanus (TDAP), Shingles vaccine series, and RSV vaccine.  Things to do to keep yourself healthy  - Exercise at least 30-45 minutes a day, 3-4 days a week.  - Eat a low-fat diet with lots of fruits and vegetables, up to 7-9 servings per day.  - Seatbelts can save your life. Wear them always.  - Smoke detectors on every level of your home, check batteries every year.  - Eye Doctor - have an eye exam every 1-2 years  - Alcohol -  If you drink, do it moderately, less than 2 drinks per day.  - Health Care Power of Attorney. Choose someone to speak for you if you are not able.  - Depression is common in our stressful world.If you're feeling down or losing interest in things you normally enjoy, please come in for a visit.  - Violence - If anyone is threatening or hurting you, please call immediately.  

## 2022-09-08 NOTE — Assessment & Plan Note (Signed)
Long discussion and education given. > 15 minutes.

## 2022-09-08 NOTE — Assessment & Plan Note (Addendum)
Check labs. Stool studies.  Recommend immodium daily as needed.

## 2022-09-08 NOTE — Assessment & Plan Note (Signed)
ORDER MAMMOGRAM °

## 2022-09-12 ENCOUNTER — Ambulatory Visit
Admission: RE | Admit: 2022-09-12 | Discharge: 2022-09-12 | Disposition: A | Discharge status: Home / Self Care | Payer: Medicare HMO | Source: Ambulatory Visit | Attending: Family Medicine | Admitting: Family Medicine

## 2022-09-12 DIAGNOSIS — Z1231 Encounter for screening mammogram for malignant neoplasm of breast: Secondary | ICD-10-CM

## 2022-10-02 IMAGING — MR MR HEAD W/O CM
9 of 10 series · 37 of 48 positions shown · non-contrast
Comparison: None.

CLINICAL DATA: Neuro deficit, acute, stroke suspected; homonymous
hemianopia

EXAM:
MRI HEAD WITHOUT CONTRAST
TECHNIQUE: Multiplanar, multiecho pulse sequences of the brain and surrounding
structures were obtained without intravenous contrast.

[Series 3: DWI · axial · 3.0mm · 1.09mm/px · z∈[-56,+73]mm · 9 of 88 slices shown (1 of 4)]
[im 1/88]
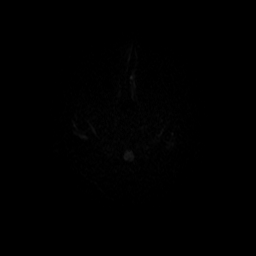
[im 11/88]
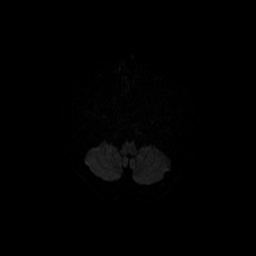
[im 22/88]
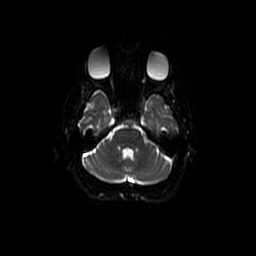
[im 33/88]
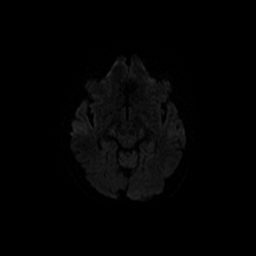
[im 44/88]
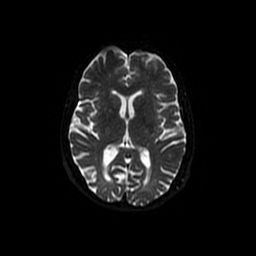
[im 55/88]
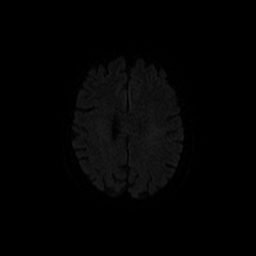
[im 66/88]
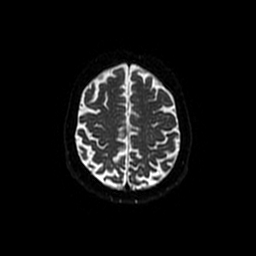
[im 77/88]
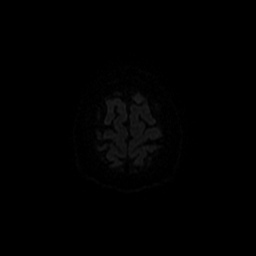
[im 88/88]
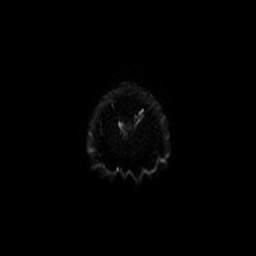

[Series 4: DWI · coronal · 5.0mm · 1.09mm/px · 7 of 68 slices shown (2 of 4)]
[im 1/68]
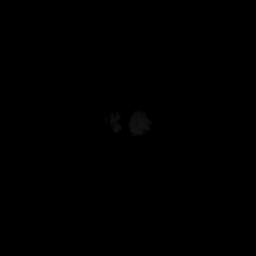
[im 12/68]
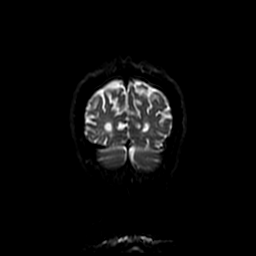
[im 23/68]
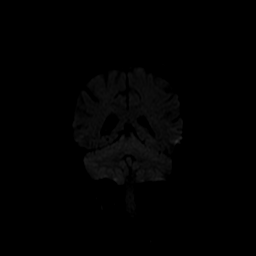
[im 34/68]
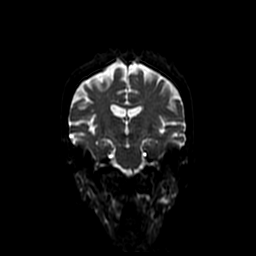
[im 45/68]
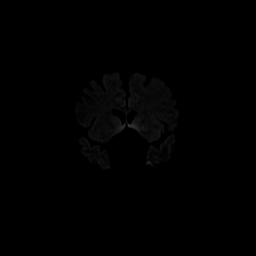
[im 56/68]
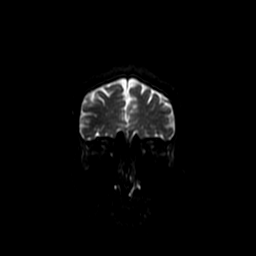
[im 68/68]
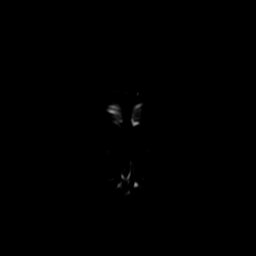

[Series 5: T1 · sagittal · 5.0mm · 0.47mm/px · 2 of 23 slices shown (1 of 2)]
[im 1/23]
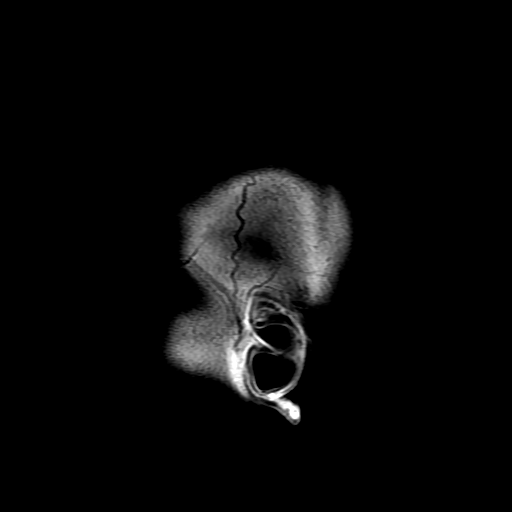
[im 23/23]
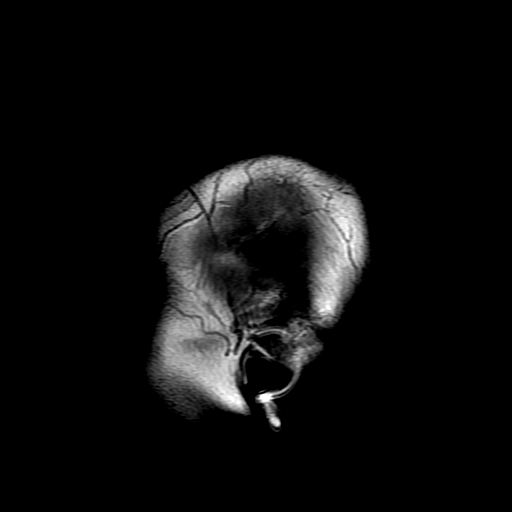

[Series 6: T2 · axial · 5.0mm · 0.43mm/px · z∈[-73,+65]mm · 3 of 24 slices shown (1 of 2)]
[im 1/24]
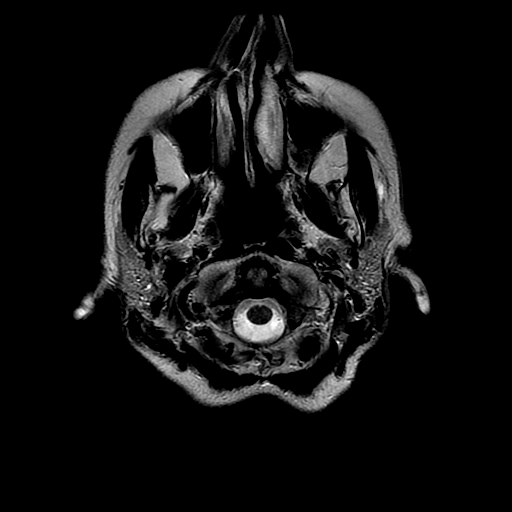
[im 12/24]
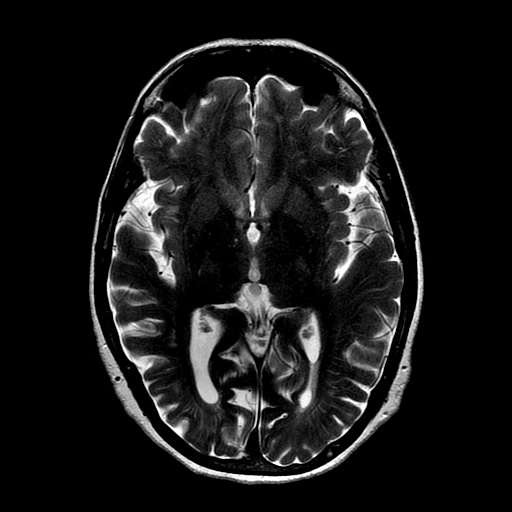
[im 24/24]
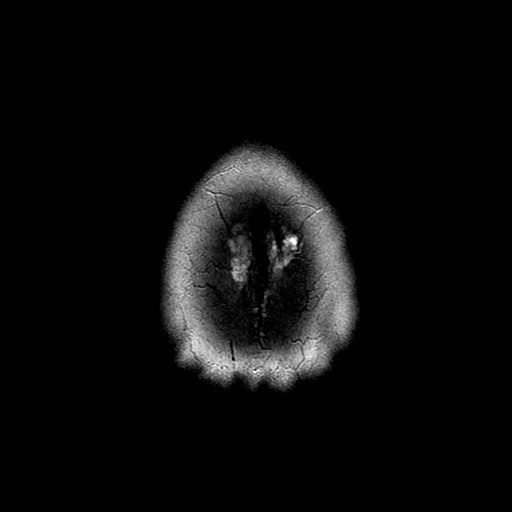

[Series 7: FLAIR · axial · 3.0mm · 0.43mm/px · z∈[-51,+75]mm · 2 of 22 slices shown]
[im 1/22]
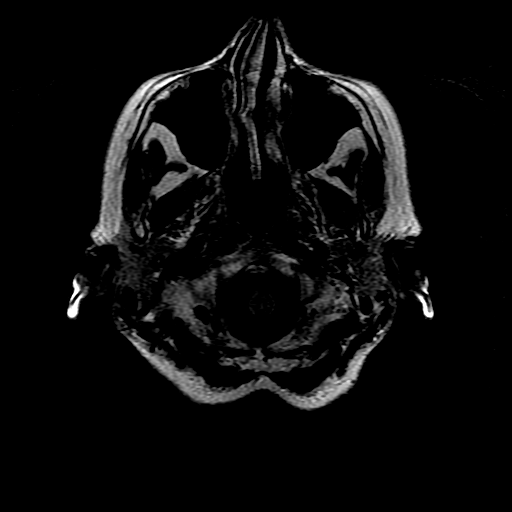
[im 22/22]
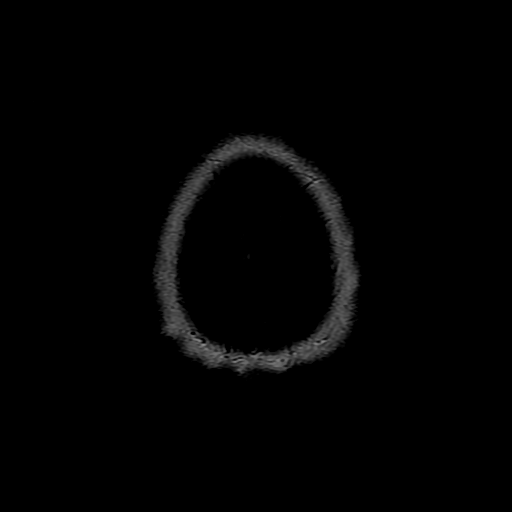

[Series 9: T1 · axial · 3.0mm · 0.47mm/px · z∈[-64,-49]mm · 2 of 92 slices shown (2 of 2)]
[im 1/92]
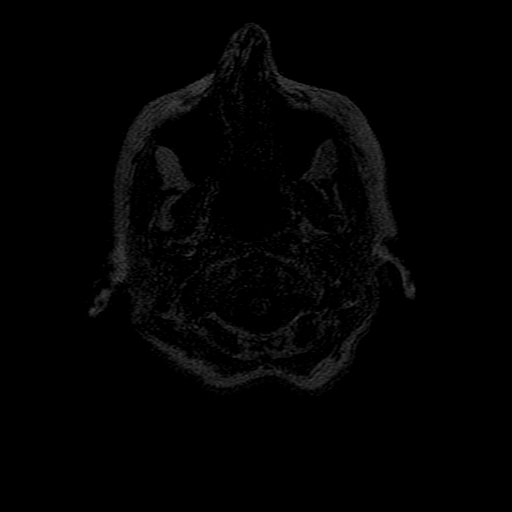
[im 11/92]
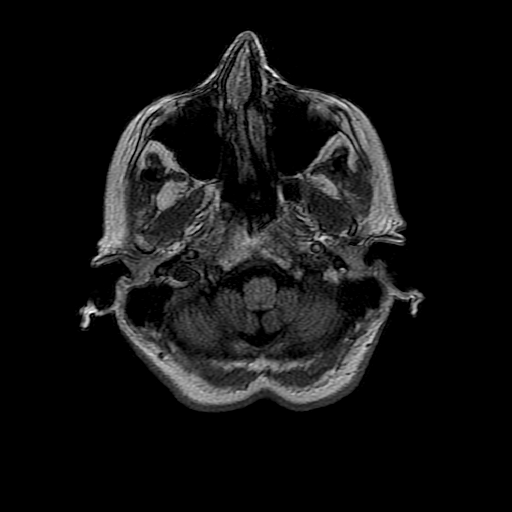

[Series 10: T2 · coronal · 5.0mm · 0.39mm/px · 3 of 27 slices shown (2 of 2)]
[im 1/27]
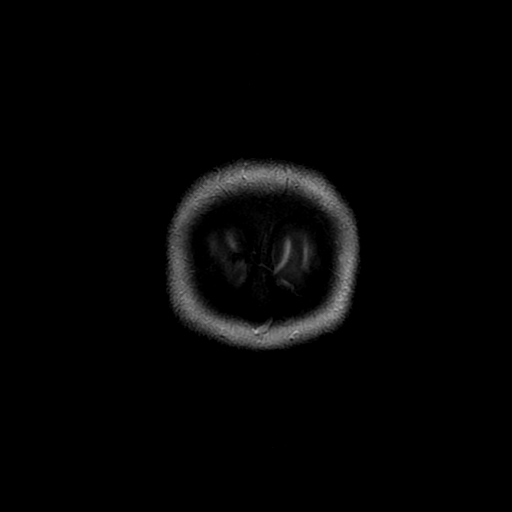
[im 14/27]
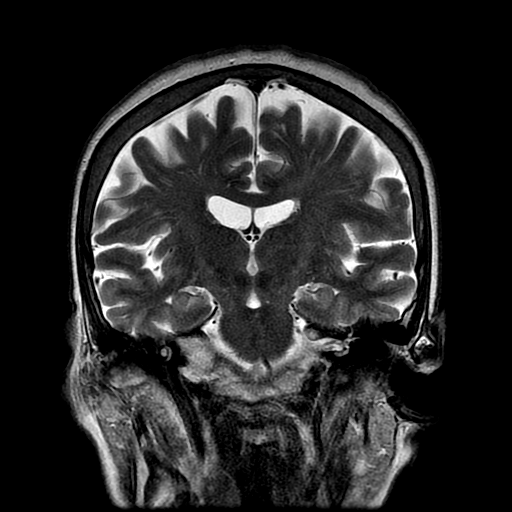
[im 27/27]
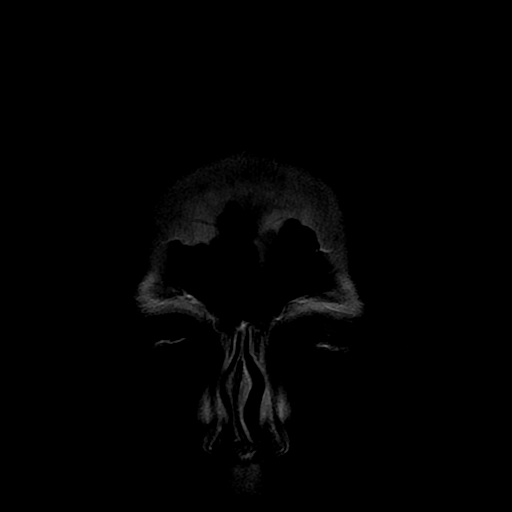

[Series 300: DWI · axial · 3.0mm · 1.09mm/px · z∈[-56,+73]mm · 5 of 44 slices shown (3 of 4)]
[im 1/44]
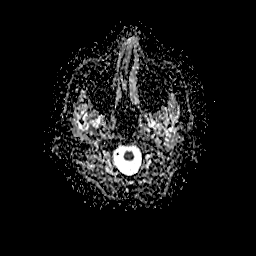
[im 11/44]
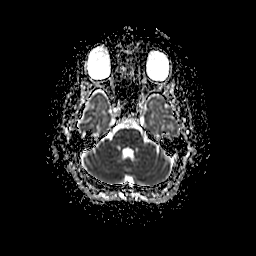
[im 22/44]
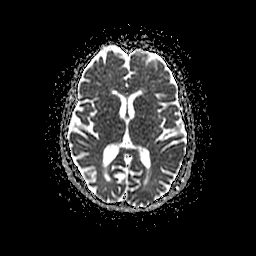
[im 33/44]
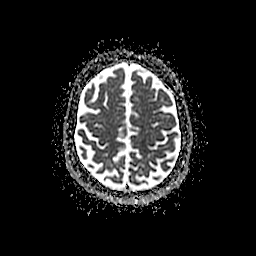
[im 44/44]
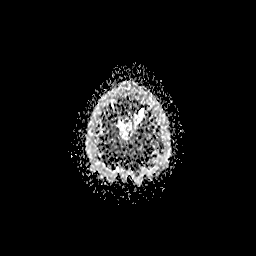

[Series 400: DWI · coronal · 5.0mm · 1.09mm/px · 4 of 34 slices shown (4 of 4)]
[im 1/34]
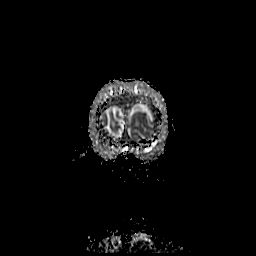
[im 12/34]
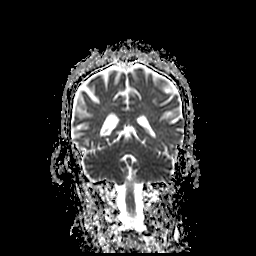
[im 23/34]
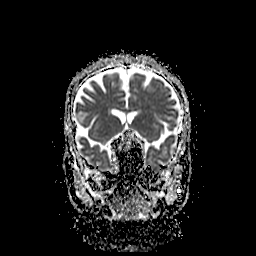
[im 34/34]
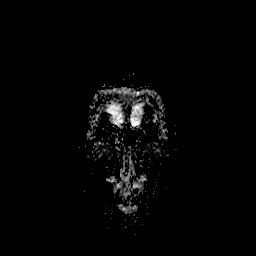

[37 of 48 positions shown; findings below may reference images not displayed]

FINDINGS: Brain: There is no acute infarction or intracranial hemorrhage.
There is no intracranial mass, mass effect, or edema. There is no
hydrocephalus or extra-axial fluid collection. Ventricles and sulci
are normal in size and configuration. Patchy T2 hyperintensity in
the supratentorial white matter is nonspecific but may reflect minor
chronic microvascular ischemic changes.

Vascular: Major vessel flow voids at the skull base are preserved.

Skull and upper cervical spine: Normal marrow signal is preserved.

Sinuses/Orbits: Paranasal sinuses are aerated. Orbits are
unremarkable.

Other: Sella is unremarkable.  Mastoid air cells are clear.
IMPRESSION: No acute infarction, hemorrhage, or mass.

Minor chronic microvascular ischemic changes.

## 2022-12-04 DIAGNOSIS — K429 Umbilical hernia without obstruction or gangrene: Secondary | ICD-10-CM | POA: Diagnosis not present

## 2022-12-04 DIAGNOSIS — K580 Irritable bowel syndrome with diarrhea: Secondary | ICD-10-CM | POA: Diagnosis not present
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
## 2022-12-20 ENCOUNTER — Ambulatory Visit: Payer: Medicare HMO | Admitting: Neurology

## 2022-12-20 VITALS — BP 112/80 | HR 102 | Ht 66.0 in | Wt 105.4 lb

## 2022-12-20 DIAGNOSIS — G3184 Mild cognitive impairment, so stated: Secondary | ICD-10-CM

## 2022-12-20 DIAGNOSIS — G319 Degenerative disease of nervous system, unspecified: Secondary | ICD-10-CM

## 2022-12-21 ENCOUNTER — Encounter: Payer: Self-pay | Admitting: Neurology
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
## 2023-03-11 ENCOUNTER — Encounter: Payer: Self-pay | Admitting: Family Medicine

## 2023-03-11 ENCOUNTER — Ambulatory Visit: Payer: Medicare HMO | Admitting: Family Medicine

## 2023-03-11 VITALS — BP 110/70 | HR 60 | Temp 96.1°F | Resp 16 | Ht 64.0 in | Wt 106.4 lb

## 2023-03-11 DIAGNOSIS — K582 Mixed irritable bowel syndrome: Secondary | ICD-10-CM

## 2023-03-11 DIAGNOSIS — G319 Degenerative disease of nervous system, unspecified: Secondary | ICD-10-CM | POA: Diagnosis not present

## 2023-03-11 DIAGNOSIS — Z23 Encounter for immunization: Secondary | ICD-10-CM

## 2023-03-14 ENCOUNTER — Inpatient Hospital Stay: Admission: RE | Admit: 2023-03-14 | Payer: Medicare HMO | Source: Ambulatory Visit

## 2023-03-17 ENCOUNTER — Encounter: Payer: Self-pay | Admitting: Family Medicine

## 2023-03-17 DIAGNOSIS — K589 Irritable bowel syndrome without diarrhea: Secondary | ICD-10-CM | POA: Insufficient documentation

## 2023-03-17 NOTE — Assessment & Plan Note (Addendum)
Continue pepcid and yogurt. Continue dicyclomine

## 2023-03-17 NOTE — Assessment & Plan Note (Signed)
Management per specialist. 

## 2023-03-18 ENCOUNTER — Encounter: Payer: Self-pay | Admitting: Family Medicine

## 2023-03-25 NOTE — Addendum Note (Signed)
Addended byBlane Ohara on: 03/25/2023 12:24 AM   Modules accepted: Level of Service

## 2023-05-20 ENCOUNTER — Encounter: Payer: Self-pay | Admitting: Neurology
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
## 2023-06-28 ENCOUNTER — Ambulatory Visit: Payer: HMO | Admitting: Neurology

## 2023-06-28 ENCOUNTER — Encounter: Payer: Self-pay | Admitting: Neurology

## 2023-06-28 VITALS — BP 135/83 | HR 61 | Ht 60.0 in | Wt 106.0 lb

## 2023-06-28 DIAGNOSIS — G319 Degenerative disease of nervous system, unspecified: Secondary | ICD-10-CM

## 2023-09-12 ENCOUNTER — Encounter: Payer: Self-pay | Admitting: Family Medicine

## 2023-09-12 ENCOUNTER — Ambulatory Visit (INDEPENDENT_AMBULATORY_CARE_PROVIDER_SITE_OTHER): Admitting: Family Medicine

## 2023-09-12 VITALS — BP 110/64 | HR 62 | Temp 97.8°F | Ht 60.0 in | Wt 104.6 lb

## 2023-09-12 DIAGNOSIS — M21611 Bunion of right foot: Secondary | ICD-10-CM | POA: Insufficient documentation

## 2023-09-12 NOTE — Patient Instructions (Signed)
 Ibuprofen 200 mg 2 pills three times a day.  Wider shoes.  Refer to podiatry.

## 2023-09-12 NOTE — Assessment & Plan Note (Signed)
 Ibuprofen 200 mg 2 pills three times a day.  Wider shoes.  Refer to podiatry.
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
## 2023-09-16 ENCOUNTER — Encounter: Payer: Self-pay | Admitting: Family Medicine

## 2023-09-16 ENCOUNTER — Ambulatory Visit: Payer: Medicare HMO | Admitting: Family Medicine

## 2023-09-16 VITALS — BP 130/74 | HR 56 | Temp 97.8°F | Ht 61.5 in | Wt 106.0 lb

## 2023-09-16 DIAGNOSIS — K580 Irritable bowel syndrome with diarrhea: Secondary | ICD-10-CM

## 2023-09-16 DIAGNOSIS — R5383 Other fatigue: Secondary | ICD-10-CM | POA: Diagnosis not present

## 2023-09-16 DIAGNOSIS — G319 Degenerative disease of nervous system, unspecified: Secondary | ICD-10-CM

## 2023-09-16 DIAGNOSIS — Z13220 Encounter for screening for lipoid disorders: Secondary | ICD-10-CM | POA: Diagnosis not present

## 2023-09-16 DIAGNOSIS — Z13820 Encounter for screening for osteoporosis: Secondary | ICD-10-CM | POA: Insufficient documentation

## 2023-09-16 DIAGNOSIS — Z23 Encounter for immunization: Secondary | ICD-10-CM

## 2023-09-16 DIAGNOSIS — M21611 Bunion of right foot: Secondary | ICD-10-CM

## 2023-09-16 DIAGNOSIS — Z780 Asymptomatic menopausal state: Secondary | ICD-10-CM

## 2023-09-16 DIAGNOSIS — K582 Mixed irritable bowel syndrome: Secondary | ICD-10-CM

## 2023-09-17 ENCOUNTER — Encounter: Payer: Self-pay | Admitting: Family Medicine

## 2023-09-17 LAB — COMPREHENSIVE METABOLIC PANEL WITH GFR
ALT: 15 IU/L (ref 0–32)
AST: 17 IU/L (ref 0–40)
Albumin: 4.5 g/dL (ref 3.9–4.9)
Alkaline Phosphatase: 89 IU/L (ref 44–121)
BUN/Creatinine Ratio: 16 (ref 12–28)
BUN: 12 mg/dL (ref 8–27)
Bilirubin Total: 1.4 mg/dL — ABNORMAL HIGH (ref 0.0–1.2)
CO2: 22 mmol/L (ref 20–29)
Calcium: 9.7 mg/dL (ref 8.7–10.3)
Chloride: 105 mmol/L (ref 96–106)
Creatinine, Ser: 0.75 mg/dL (ref 0.57–1.00)
Globulin, Total: 1.9 g/dL (ref 1.5–4.5)
Glucose: 88 mg/dL (ref 70–99)
Potassium: 4.8 mmol/L (ref 3.5–5.2)
Sodium: 141 mmol/L (ref 134–144)
Total Protein: 6.4 g/dL (ref 6.0–8.5)
eGFR: 87 mL/min/{1.73_m2} (ref >59)

## 2023-09-17 LAB — CBC WITH DIFFERENTIAL/PLATELET
Basophils Absolute: 0 10*3/uL (ref 0.0–0.2)
Basos: 1 %
EOS (ABSOLUTE): 0.1 10*3/uL (ref 0.0–0.4)
Eos: 2 %
Hematocrit: 39.3 % (ref 34.0–46.6)
Hemoglobin: 13 g/dL (ref 11.1–15.9)
Immature Grans (Abs): 0 10*3/uL (ref 0.0–0.1)
Immature Granulocytes: 0 %
Lymphocytes Absolute: 1.4 10*3/uL (ref 0.7–3.1)
Lymphs: 32 %
MCH: 31 pg (ref 26.6–33.0)
MCHC: 33.1 g/dL (ref 31.5–35.7)
MCV: 94 fL (ref 79–97)
Monocytes Absolute: 0.5 10*3/uL (ref 0.1–0.9)
Monocytes: 10 %
Neutrophils Absolute: 2.4 10*3/uL (ref 1.4–7.0)
Neutrophils: 55 %
Platelets: 298 10*3/uL (ref 150–450)
RBC: 4.2 x10E6/uL (ref 3.77–5.28)
RDW: 12.6 % (ref 11.7–15.4)
WBC: 4.4 10*3/uL (ref 3.4–10.8)

## 2023-09-17 LAB — LIPID PANEL
Chol/HDL Ratio: 2.3 ratio (ref 0.0–4.4)
Cholesterol, Total: 175 mg/dL (ref 100–199)
HDL: 76 mg/dL (ref >39)
LDL Chol Calc (NIH): 84 mg/dL (ref 0–99)
Triglycerides: 81 mg/dL (ref 0–149)
VLDL Cholesterol Cal: 15 mg/dL (ref 5–40)

## 2023-09-17 LAB — TSH: TSH: 1.76 u[IU]/mL (ref 0.450–4.500)

## 2023-09-18 ENCOUNTER — Telehealth: Payer: Self-pay

## 2023-09-18 NOTE — Telephone Encounter (Signed)
 Can you reach out to patient regarding her appt.  Copied from CRM [id number]. Topic: Referral - Request for Referral >> Sep 18, 2023 12:30 PM Rosaria Common wrote: Did the patient discuss referral with their provider in the last year? Yes (If No - schedule appointment) (If Yes - send message)  Appointment offered? No  Type of order/referral and detailed reason for visit: Podiatry  Preference of office, provider, location: [facility]   If referral order, have you been seen by this specialty before? No (If Yes, this issue or another issue? When? Where?  Can we respond through MyChart? Yes  Not in network with Podiatrist referred to.

## 2023-09-20 ENCOUNTER — Ambulatory Visit (HOSPITAL_BASED_OUTPATIENT_CLINIC_OR_DEPARTMENT_OTHER)
Admission: RE | Admit: 2023-09-20 | Discharge: 2023-09-20 | Disposition: A | Discharge status: Home / Self Care | Source: Ambulatory Visit | Attending: Family Medicine | Admitting: Family Medicine

## 2023-09-20 DIAGNOSIS — Z13820 Encounter for screening for osteoporosis: Secondary | ICD-10-CM | POA: Diagnosis not present

## 2023-09-20 DIAGNOSIS — Z780 Asymptomatic menopausal state: Secondary | ICD-10-CM

## 2023-09-21 NOTE — Assessment & Plan Note (Signed)
 Check labs

## 2023-09-21 NOTE — Assessment & Plan Note (Signed)
 Continue pepcid and yogurt. Continue dicyclomine 

## 2023-09-21 NOTE — Assessment & Plan Note (Addendum)
 Progressing.  Posterior Cortical Atrophy affecting vision and depth perception, resulting in inability to drive and work, causing frustration and loss of independence. Under regular care of Dr. Festus Hubert and Dr. Bernetta Brilliant. - Continue follow-ups with Dr. Festus Hubert every six months. - Continue ophthalmology check-ups with Dr. Bernetta Brilliant.

## 2023-09-22 DIAGNOSIS — Z23 Encounter for immunization: Secondary | ICD-10-CM | POA: Insufficient documentation

## 2023-09-22 NOTE — Assessment & Plan Note (Signed)
 Check lipids

## 2023-09-22 NOTE — Assessment & Plan Note (Signed)
-   Order bone density test at the med center in [place].

## 2023-09-22 NOTE — Assessment & Plan Note (Signed)
 Awaiting podiatry referral for foot issue.

## 2023-09-22 NOTE — Assessment & Plan Note (Signed)
-   Administer COVID booster vaccine. - Recommend shingles and tetanus vaccines, covered by Medicare at the pharmacy.

## 2023-09-29 ENCOUNTER — Encounter: Payer: Self-pay | Admitting: Family Medicine

## 2023-09-30 ENCOUNTER — Ambulatory Visit: Admitting: Orthopedic Surgery

## 2023-09-30 ENCOUNTER — Other Ambulatory Visit (INDEPENDENT_AMBULATORY_CARE_PROVIDER_SITE_OTHER): Payer: Self-pay

## 2023-09-30 DIAGNOSIS — M79671 Pain in right foot: Secondary | ICD-10-CM

## 2023-09-30 DIAGNOSIS — M2021 Hallux rigidus, right foot: Secondary | ICD-10-CM

## 2023-10-01 ENCOUNTER — Encounter: Payer: Self-pay | Admitting: Orthopedic Surgery
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
## 2023-12-31 ENCOUNTER — Ambulatory Visit: Payer: HMO | Admitting: Neurology

## 2023-12-31 ENCOUNTER — Encounter: Payer: Self-pay | Admitting: Neurology

## 2023-12-31 VITALS — BP 134/75 | HR 56 | Ht 66.0 in | Wt 107.0 lb

## 2023-12-31 DIAGNOSIS — G319 Degenerative disease of nervous system, unspecified: Secondary | ICD-10-CM

## 2023-12-31 DIAGNOSIS — G3184 Mild cognitive impairment, so stated: Secondary | ICD-10-CM
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
## 2024-03-16 ENCOUNTER — Encounter: Payer: Self-pay | Admitting: Family Medicine

## 2024-03-16 ENCOUNTER — Ambulatory Visit: Admitting: Family Medicine

## 2024-03-16 VITALS — BP 112/68 | HR 64 | Temp 98.2°F | Ht 66.0 in | Wt 108.0 lb

## 2024-03-16 DIAGNOSIS — J301 Allergic rhinitis due to pollen: Secondary | ICD-10-CM

## 2024-03-16 DIAGNOSIS — Z23 Encounter for immunization: Secondary | ICD-10-CM

## 2024-03-16 DIAGNOSIS — G319 Degenerative disease of nervous system, unspecified: Secondary | ICD-10-CM

## 2024-03-16 DIAGNOSIS — Z0000 Encounter for general adult medical examination without abnormal findings: Secondary | ICD-10-CM

## 2024-03-16 NOTE — Patient Instructions (Signed)
  VISIT SUMMARY: You had a follow-up visit to discuss your posterior cortical atrophy and other symptoms. We reviewed your vision and memory issues, neuropsychiatric symptoms, sleep and respiratory concerns, cough, gastrointestinal symptoms, urinary frequency, and dizziness. We also conducted your Medicare annual wellness visit.  YOUR PLAN: POSTERIOR CORTICAL ATROPHY: Your vision and memory are worsening, and you are seeing bright colors frequently. You are also feeling frustrated due to your condition. -Continue to follow up with your ophthalmologist, Dr. Loreli.  ALLERGIC RHINITIS: You have phlegm in your throat likely due to nasal drainage and swollen nasal passages, which suggest allergies. -Take an over-the-counter allergy medication such as Zyrtec, Allegra, or Claritin.  MEDICARE ANNUAL WELLNESS VISIT: You are due for your Medicare annual wellness visit. -We conducted your Medicare annual wellness visit in-office today with the nurse.                      Contains text generated by Abridge.                                 Contains text generated by Abridge.
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
## 2024-04-06 ENCOUNTER — Encounter: Payer: Self-pay | Admitting: Radiology
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
## 2024-06-18 ENCOUNTER — Ambulatory Visit: Admitting: Neurology

## 2024-06-18 VITALS — BP 134/84 | HR 64 | Wt 106.0 lb

## 2024-06-18 DIAGNOSIS — G319 Degenerative disease of nervous system, unspecified: Secondary | ICD-10-CM

## 2024-06-18 NOTE — Patient Instructions (Signed)
 F/u 6 months

## 2024-06-19 ENCOUNTER — Encounter: Payer: Self-pay | Admitting: Neurology

## 2024-07-09 ENCOUNTER — Ambulatory Visit: Admitting: Neurology

## 2024-09-14 ENCOUNTER — Ambulatory Visit: Admitting: Family Medicine

## 2024-12-29 ENCOUNTER — Ambulatory Visit: Payer: Self-pay | Admitting: Neurology

## 2025-03-17 ENCOUNTER — Ambulatory Visit: Admitting: Family Medicine
